# Patient Record
Sex: Male | Born: 1935 | Race: White | Hispanic: No | Marital: Married | Smoking: Former smoker
Health system: Southern US, Community
[De-identification: ages and names within clinical notes are randomized; demographics above are authoritative.]

## PROBLEM LIST (undated history)

## (undated) DIAGNOSIS — I10 Essential (primary) hypertension: Secondary | ICD-10-CM

## (undated) DIAGNOSIS — M5136 Other intervertebral disc degeneration, lumbar region: Secondary | ICD-10-CM

## (undated) DIAGNOSIS — N289 Disorder of kidney and ureter, unspecified: Secondary | ICD-10-CM

## (undated) DIAGNOSIS — H409 Unspecified glaucoma: Secondary | ICD-10-CM

## (undated) DIAGNOSIS — M1990 Unspecified osteoarthritis, unspecified site: Secondary | ICD-10-CM

## (undated) DIAGNOSIS — E7800 Pure hypercholesterolemia, unspecified: Secondary | ICD-10-CM

---

## 2011-08-29 DIAGNOSIS — Z125 Encounter for screening for malignant neoplasm of prostate: Secondary | ICD-10-CM | POA: Diagnosis not present

## 2011-08-29 DIAGNOSIS — I10 Essential (primary) hypertension: Secondary | ICD-10-CM | POA: Diagnosis not present

## 2011-08-29 DIAGNOSIS — E785 Hyperlipidemia, unspecified: Secondary | ICD-10-CM | POA: Diagnosis not present

## 2011-09-05 DIAGNOSIS — Z0000 Encounter for general adult medical examination without abnormal findings: Secondary | ICD-10-CM | POA: Diagnosis not present

## 2011-09-05 DIAGNOSIS — I10 Essential (primary) hypertension: Secondary | ICD-10-CM | POA: Diagnosis not present

## 2011-09-05 DIAGNOSIS — R972 Elevated prostate specific antigen [PSA]: Secondary | ICD-10-CM | POA: Diagnosis not present

## 2011-09-05 DIAGNOSIS — E785 Hyperlipidemia, unspecified: Secondary | ICD-10-CM | POA: Diagnosis not present

## 2011-10-18 DIAGNOSIS — H409 Unspecified glaucoma: Secondary | ICD-10-CM | POA: Diagnosis not present

## 2011-10-18 DIAGNOSIS — H2510 Age-related nuclear cataract, unspecified eye: Secondary | ICD-10-CM | POA: Diagnosis not present

## 2011-10-18 DIAGNOSIS — H4011X0 Primary open-angle glaucoma, stage unspecified: Secondary | ICD-10-CM | POA: Diagnosis not present

## 2011-10-18 DIAGNOSIS — Z961 Presence of intraocular lens: Secondary | ICD-10-CM | POA: Diagnosis not present

## 2012-03-05 DIAGNOSIS — E669 Obesity, unspecified: Secondary | ICD-10-CM | POA: Diagnosis not present

## 2012-03-05 DIAGNOSIS — E785 Hyperlipidemia, unspecified: Secondary | ICD-10-CM | POA: Diagnosis not present

## 2012-03-05 DIAGNOSIS — N401 Enlarged prostate with lower urinary tract symptoms: Secondary | ICD-10-CM | POA: Diagnosis not present

## 2012-03-05 DIAGNOSIS — I10 Essential (primary) hypertension: Secondary | ICD-10-CM | POA: Diagnosis not present

## 2012-04-15 DIAGNOSIS — H409 Unspecified glaucoma: Secondary | ICD-10-CM | POA: Diagnosis not present

## 2012-04-15 DIAGNOSIS — H4011X0 Primary open-angle glaucoma, stage unspecified: Secondary | ICD-10-CM | POA: Diagnosis not present

## 2012-09-04 DIAGNOSIS — E785 Hyperlipidemia, unspecified: Secondary | ICD-10-CM | POA: Diagnosis not present

## 2012-09-04 DIAGNOSIS — Z125 Encounter for screening for malignant neoplasm of prostate: Secondary | ICD-10-CM | POA: Diagnosis not present

## 2012-09-04 DIAGNOSIS — I10 Essential (primary) hypertension: Secondary | ICD-10-CM | POA: Diagnosis not present

## 2012-10-15 DIAGNOSIS — H409 Unspecified glaucoma: Secondary | ICD-10-CM | POA: Diagnosis not present

## 2012-10-15 DIAGNOSIS — H4011X0 Primary open-angle glaucoma, stage unspecified: Secondary | ICD-10-CM | POA: Diagnosis not present

## 2013-03-11 DIAGNOSIS — N401 Enlarged prostate with lower urinary tract symptoms: Secondary | ICD-10-CM | POA: Diagnosis not present

## 2013-03-11 DIAGNOSIS — M1990 Unspecified osteoarthritis, unspecified site: Secondary | ICD-10-CM | POA: Diagnosis not present

## 2013-03-11 DIAGNOSIS — Z23 Encounter for immunization: Secondary | ICD-10-CM | POA: Diagnosis not present

## 2013-03-11 DIAGNOSIS — Z6834 Body mass index (BMI) 34.0-34.9, adult: Secondary | ICD-10-CM | POA: Diagnosis not present

## 2013-03-11 DIAGNOSIS — E785 Hyperlipidemia, unspecified: Secondary | ICD-10-CM | POA: Diagnosis not present

## 2013-03-11 DIAGNOSIS — I10 Essential (primary) hypertension: Secondary | ICD-10-CM | POA: Diagnosis not present

## 2013-03-11 DIAGNOSIS — R972 Elevated prostate specific antigen [PSA]: Secondary | ICD-10-CM | POA: Diagnosis not present

## 2013-03-11 DIAGNOSIS — H409 Unspecified glaucoma: Secondary | ICD-10-CM | POA: Diagnosis not present

## 2013-03-12 DIAGNOSIS — R972 Elevated prostate specific antigen [PSA]: Secondary | ICD-10-CM | POA: Diagnosis not present

## 2013-04-22 DIAGNOSIS — H4011X0 Primary open-angle glaucoma, stage unspecified: Secondary | ICD-10-CM | POA: Diagnosis not present

## 2013-04-22 DIAGNOSIS — H409 Unspecified glaucoma: Secondary | ICD-10-CM | POA: Diagnosis not present

## 2013-09-12 DIAGNOSIS — E785 Hyperlipidemia, unspecified: Secondary | ICD-10-CM | POA: Diagnosis not present

## 2013-09-12 DIAGNOSIS — I10 Essential (primary) hypertension: Secondary | ICD-10-CM | POA: Diagnosis not present

## 2013-09-12 DIAGNOSIS — Z125 Encounter for screening for malignant neoplasm of prostate: Secondary | ICD-10-CM | POA: Diagnosis not present

## 2013-09-16 DIAGNOSIS — I10 Essential (primary) hypertension: Secondary | ICD-10-CM | POA: Diagnosis not present

## 2013-09-16 DIAGNOSIS — H409 Unspecified glaucoma: Secondary | ICD-10-CM | POA: Diagnosis not present

## 2013-09-16 DIAGNOSIS — Z0000 Encounter for general adult medical examination without abnormal findings: Secondary | ICD-10-CM | POA: Diagnosis not present

## 2013-09-16 DIAGNOSIS — E785 Hyperlipidemia, unspecified: Secondary | ICD-10-CM | POA: Diagnosis not present

## 2013-09-16 DIAGNOSIS — E669 Obesity, unspecified: Secondary | ICD-10-CM | POA: Diagnosis not present

## 2013-09-16 DIAGNOSIS — N401 Enlarged prostate with lower urinary tract symptoms: Secondary | ICD-10-CM | POA: Diagnosis not present

## 2013-09-16 DIAGNOSIS — H6120 Impacted cerumen, unspecified ear: Secondary | ICD-10-CM | POA: Diagnosis not present

## 2013-09-16 DIAGNOSIS — Z23 Encounter for immunization: Secondary | ICD-10-CM | POA: Diagnosis not present

## 2013-09-16 DIAGNOSIS — Z125 Encounter for screening for malignant neoplasm of prostate: Secondary | ICD-10-CM | POA: Diagnosis not present

## 2013-09-16 DIAGNOSIS — M1990 Unspecified osteoarthritis, unspecified site: Secondary | ICD-10-CM | POA: Diagnosis not present

## 2013-09-16 DIAGNOSIS — Z1331 Encounter for screening for depression: Secondary | ICD-10-CM | POA: Diagnosis not present

## 2013-11-04 DIAGNOSIS — Z961 Presence of intraocular lens: Secondary | ICD-10-CM | POA: Diagnosis not present

## 2013-11-04 DIAGNOSIS — H4011X0 Primary open-angle glaucoma, stage unspecified: Secondary | ICD-10-CM | POA: Diagnosis not present

## 2013-11-04 DIAGNOSIS — H2510 Age-related nuclear cataract, unspecified eye: Secondary | ICD-10-CM | POA: Diagnosis not present

## 2013-11-04 DIAGNOSIS — H409 Unspecified glaucoma: Secondary | ICD-10-CM | POA: Diagnosis not present

## 2013-12-16 DIAGNOSIS — H6120 Impacted cerumen, unspecified ear: Secondary | ICD-10-CM | POA: Diagnosis not present

## 2013-12-16 DIAGNOSIS — H9190 Unspecified hearing loss, unspecified ear: Secondary | ICD-10-CM | POA: Diagnosis not present

## 2014-01-02 DIAGNOSIS — Z6834 Body mass index (BMI) 34.0-34.9, adult: Secondary | ICD-10-CM | POA: Diagnosis not present

## 2014-01-02 DIAGNOSIS — H903 Sensorineural hearing loss, bilateral: Secondary | ICD-10-CM | POA: Diagnosis not present

## 2014-01-02 DIAGNOSIS — H908 Mixed conductive and sensorineural hearing loss, unspecified: Secondary | ICD-10-CM | POA: Diagnosis not present

## 2014-01-02 DIAGNOSIS — H9190 Unspecified hearing loss, unspecified ear: Secondary | ICD-10-CM | POA: Diagnosis not present

## 2014-01-02 DIAGNOSIS — H905 Unspecified sensorineural hearing loss: Secondary | ICD-10-CM | POA: Diagnosis not present

## 2014-03-24 DIAGNOSIS — E669 Obesity, unspecified: Secondary | ICD-10-CM | POA: Diagnosis not present

## 2014-03-24 DIAGNOSIS — N138 Other obstructive and reflux uropathy: Secondary | ICD-10-CM | POA: Diagnosis not present

## 2014-03-24 DIAGNOSIS — Z6834 Body mass index (BMI) 34.0-34.9, adult: Secondary | ICD-10-CM | POA: Diagnosis not present

## 2014-03-24 DIAGNOSIS — M1990 Unspecified osteoarthritis, unspecified site: Secondary | ICD-10-CM | POA: Diagnosis not present

## 2014-03-24 DIAGNOSIS — I10 Essential (primary) hypertension: Secondary | ICD-10-CM | POA: Diagnosis not present

## 2014-03-24 DIAGNOSIS — E785 Hyperlipidemia, unspecified: Secondary | ICD-10-CM | POA: Diagnosis not present

## 2014-03-24 DIAGNOSIS — N401 Enlarged prostate with lower urinary tract symptoms: Secondary | ICD-10-CM | POA: Diagnosis not present

## 2014-03-24 DIAGNOSIS — H409 Unspecified glaucoma: Secondary | ICD-10-CM | POA: Diagnosis not present

## 2014-03-24 DIAGNOSIS — H9190 Unspecified hearing loss, unspecified ear: Secondary | ICD-10-CM | POA: Diagnosis not present

## 2014-05-05 DIAGNOSIS — Z961 Presence of intraocular lens: Secondary | ICD-10-CM | POA: Diagnosis not present

## 2014-05-05 DIAGNOSIS — H4011X1 Primary open-angle glaucoma, mild stage: Secondary | ICD-10-CM | POA: Diagnosis not present

## 2014-05-05 DIAGNOSIS — H2512 Age-related nuclear cataract, left eye: Secondary | ICD-10-CM | POA: Diagnosis not present

## 2014-11-04 DIAGNOSIS — H4011X1 Primary open-angle glaucoma, mild stage: Secondary | ICD-10-CM | POA: Diagnosis not present

## 2014-11-04 DIAGNOSIS — H2512 Age-related nuclear cataract, left eye: Secondary | ICD-10-CM | POA: Diagnosis not present

## 2014-11-04 DIAGNOSIS — Z961 Presence of intraocular lens: Secondary | ICD-10-CM | POA: Diagnosis not present

## 2014-11-04 DIAGNOSIS — H53002 Unspecified amblyopia, left eye: Secondary | ICD-10-CM | POA: Diagnosis not present

## 2014-12-09 DIAGNOSIS — E785 Hyperlipidemia, unspecified: Secondary | ICD-10-CM | POA: Diagnosis not present

## 2014-12-09 DIAGNOSIS — I10 Essential (primary) hypertension: Secondary | ICD-10-CM | POA: Diagnosis not present

## 2014-12-09 DIAGNOSIS — Z125 Encounter for screening for malignant neoplasm of prostate: Secondary | ICD-10-CM | POA: Diagnosis not present

## 2014-12-09 DIAGNOSIS — R972 Elevated prostate specific antigen [PSA]: Secondary | ICD-10-CM | POA: Diagnosis not present

## 2014-12-16 DIAGNOSIS — N401 Enlarged prostate with lower urinary tract symptoms: Secondary | ICD-10-CM | POA: Diagnosis not present

## 2014-12-16 DIAGNOSIS — H9190 Unspecified hearing loss, unspecified ear: Secondary | ICD-10-CM | POA: Diagnosis not present

## 2014-12-16 DIAGNOSIS — R972 Elevated prostate specific antigen [PSA]: Secondary | ICD-10-CM | POA: Diagnosis not present

## 2014-12-16 DIAGNOSIS — H409 Unspecified glaucoma: Secondary | ICD-10-CM | POA: Diagnosis not present

## 2014-12-16 DIAGNOSIS — E669 Obesity, unspecified: Secondary | ICD-10-CM | POA: Diagnosis not present

## 2014-12-16 DIAGNOSIS — I10 Essential (primary) hypertension: Secondary | ICD-10-CM | POA: Diagnosis not present

## 2014-12-16 DIAGNOSIS — M1990 Unspecified osteoarthritis, unspecified site: Secondary | ICD-10-CM | POA: Diagnosis not present

## 2014-12-16 DIAGNOSIS — E785 Hyperlipidemia, unspecified: Secondary | ICD-10-CM | POA: Diagnosis not present

## 2014-12-16 DIAGNOSIS — Z1389 Encounter for screening for other disorder: Secondary | ICD-10-CM | POA: Diagnosis not present

## 2014-12-16 DIAGNOSIS — D692 Other nonthrombocytopenic purpura: Secondary | ICD-10-CM | POA: Diagnosis not present

## 2014-12-16 DIAGNOSIS — Z6833 Body mass index (BMI) 33.0-33.9, adult: Secondary | ICD-10-CM | POA: Diagnosis not present

## 2014-12-16 DIAGNOSIS — Z0000 Encounter for general adult medical examination without abnormal findings: Secondary | ICD-10-CM | POA: Diagnosis not present

## 2015-05-12 DIAGNOSIS — H2512 Age-related nuclear cataract, left eye: Secondary | ICD-10-CM | POA: Diagnosis not present

## 2015-05-12 DIAGNOSIS — Z961 Presence of intraocular lens: Secondary | ICD-10-CM | POA: Diagnosis not present

## 2015-05-12 DIAGNOSIS — H401132 Primary open-angle glaucoma, bilateral, moderate stage: Secondary | ICD-10-CM | POA: Diagnosis not present

## 2015-09-10 ENCOUNTER — Emergency Department (HOSPITAL_COMMUNITY): Payer: Medicare Other

## 2015-09-10 ENCOUNTER — Inpatient Hospital Stay (HOSPITAL_COMMUNITY)
Admission: EM | Admit: 2015-09-10 | Discharge: 2015-09-17 | DRG: 549 | Disposition: A | Discharge status: Home Health | Payer: Medicare Other | Attending: Internal Medicine | Admitting: Internal Medicine

## 2015-09-10 ENCOUNTER — Encounter (HOSPITAL_COMMUNITY): Payer: Self-pay | Admitting: Emergency Medicine

## 2015-09-10 DIAGNOSIS — M545 Low back pain, unspecified: Secondary | ICD-10-CM

## 2015-09-10 DIAGNOSIS — M25552 Pain in left hip: Secondary | ICD-10-CM | POA: Diagnosis present

## 2015-09-10 DIAGNOSIS — Z880 Allergy status to penicillin: Secondary | ICD-10-CM

## 2015-09-10 DIAGNOSIS — Z87891 Personal history of nicotine dependence: Secondary | ICD-10-CM | POA: Diagnosis not present

## 2015-09-10 DIAGNOSIS — E7800 Pure hypercholesterolemia, unspecified: Secondary | ICD-10-CM | POA: Diagnosis not present

## 2015-09-10 DIAGNOSIS — M549 Dorsalgia, unspecified: Secondary | ICD-10-CM | POA: Diagnosis present

## 2015-09-10 DIAGNOSIS — B953 Streptococcus pneumoniae as the cause of diseases classified elsewhere: Secondary | ICD-10-CM | POA: Diagnosis present

## 2015-09-10 DIAGNOSIS — M4646 Discitis, unspecified, lumbar region: Secondary | ICD-10-CM | POA: Diagnosis present

## 2015-09-10 DIAGNOSIS — N179 Acute kidney failure, unspecified: Secondary | ICD-10-CM | POA: Diagnosis not present

## 2015-09-10 DIAGNOSIS — M1990 Unspecified osteoarthritis, unspecified site: Secondary | ICD-10-CM | POA: Diagnosis present

## 2015-09-10 DIAGNOSIS — E785 Hyperlipidemia, unspecified: Secondary | ICD-10-CM | POA: Diagnosis present

## 2015-09-10 DIAGNOSIS — M4626 Osteomyelitis of vertebra, lumbar region: Secondary | ICD-10-CM | POA: Diagnosis present

## 2015-09-10 DIAGNOSIS — H9190 Unspecified hearing loss, unspecified ear: Secondary | ICD-10-CM | POA: Diagnosis not present

## 2015-09-10 DIAGNOSIS — Z79899 Other long term (current) drug therapy: Secondary | ICD-10-CM

## 2015-09-10 DIAGNOSIS — I10 Essential (primary) hypertension: Secondary | ICD-10-CM | POA: Diagnosis not present

## 2015-09-10 DIAGNOSIS — M0028 Other streptococcal arthritis, vertebrae: Principal | ICD-10-CM | POA: Diagnosis present

## 2015-09-10 DIAGNOSIS — H6092 Unspecified otitis externa, left ear: Secondary | ICD-10-CM | POA: Diagnosis not present

## 2015-09-10 DIAGNOSIS — R52 Pain, unspecified: Secondary | ICD-10-CM

## 2015-09-10 DIAGNOSIS — Z7982 Long term (current) use of aspirin: Secondary | ICD-10-CM

## 2015-09-10 DIAGNOSIS — R509 Fever, unspecified: Secondary | ICD-10-CM | POA: Diagnosis not present

## 2015-09-10 DIAGNOSIS — K5900 Constipation, unspecified: Secondary | ICD-10-CM | POA: Diagnosis not present

## 2015-09-10 DIAGNOSIS — M5136 Other intervertebral disc degeneration, lumbar region: Secondary | ICD-10-CM | POA: Diagnosis not present

## 2015-09-10 DIAGNOSIS — H409 Unspecified glaucoma: Secondary | ICD-10-CM | POA: Diagnosis present

## 2015-09-10 DIAGNOSIS — M791 Myalgia: Secondary | ICD-10-CM | POA: Diagnosis present

## 2015-09-10 DIAGNOSIS — M1612 Unilateral primary osteoarthritis, left hip: Secondary | ICD-10-CM | POA: Diagnosis not present

## 2015-09-10 DIAGNOSIS — R6889 Other general symptoms and signs: Secondary | ICD-10-CM | POA: Diagnosis present

## 2015-09-10 DIAGNOSIS — R05 Cough: Secondary | ICD-10-CM | POA: Diagnosis not present

## 2015-09-10 DIAGNOSIS — G8929 Other chronic pain: Secondary | ICD-10-CM | POA: Diagnosis present

## 2015-09-10 DIAGNOSIS — M869 Osteomyelitis, unspecified: Secondary | ICD-10-CM

## 2015-09-10 DIAGNOSIS — E876 Hypokalemia: Secondary | ICD-10-CM | POA: Diagnosis present

## 2015-09-10 DIAGNOSIS — R7881 Bacteremia: Secondary | ICD-10-CM

## 2015-09-10 HISTORY — DX: Pure hypercholesterolemia, unspecified: E78.00

## 2015-09-10 HISTORY — DX: Unspecified osteoarthritis, unspecified site: M19.90

## 2015-09-10 HISTORY — DX: Essential (primary) hypertension: I10

## 2015-09-10 HISTORY — DX: Other intervertebral disc degeneration, lumbar region: M51.36

## 2015-09-10 HISTORY — DX: Unspecified glaucoma: H40.9

## 2015-09-10 LAB — CBC WITH DIFFERENTIAL/PLATELET
Basophils Absolute: 0 10*3/uL (ref 0.0–0.1)
Basophils Relative: 0 %
EOS ABS: 0 10*3/uL (ref 0.0–0.7)
Eosinophils Relative: 0 %
HCT: 39.7 % (ref 39.0–52.0)
Hemoglobin: 13.6 g/dL (ref 13.0–17.0)
LYMPHS PCT: 5 %
Lymphs Abs: 0.6 10*3/uL — ABNORMAL LOW (ref 0.7–4.0)
MCH: 30.6 pg (ref 26.0–34.0)
MCHC: 34.3 g/dL (ref 30.0–36.0)
MCV: 89.2 fL (ref 78.0–100.0)
MONO ABS: 1.2 10*3/uL — AB (ref 0.1–1.0)
Monocytes Relative: 10 %
NEUTROS PCT: 85 %
Neutro Abs: 10.6 10*3/uL — ABNORMAL HIGH (ref 1.7–7.7)
PLATELETS: 141 10*3/uL — AB (ref 150–400)
RBC: 4.45 MIL/uL (ref 4.22–5.81)
RDW: 14.9 % (ref 11.5–15.5)
WBC: 12.4 10*3/uL — AB (ref 4.0–10.5)

## 2015-09-10 LAB — URINE MICROSCOPIC-ADD ON: SQUAMOUS EPITHELIAL / LPF: NONE SEEN

## 2015-09-10 LAB — BASIC METABOLIC PANEL
ANION GAP: 10 (ref 5–15)
BUN: 25 mg/dL — ABNORMAL HIGH (ref 6–20)
CALCIUM: 9.1 mg/dL (ref 8.9–10.3)
CO2: 21 mmol/L — ABNORMAL LOW (ref 22–32)
CREATININE: 1.36 mg/dL — AB (ref 0.61–1.24)
Chloride: 103 mmol/L (ref 101–111)
GFR, EST AFRICAN AMERICAN: 53 mL/min — AB (ref >60)
GFR, EST NON AFRICAN AMERICAN: 46 mL/min — AB (ref >60)
Glucose, Bld: 132 mg/dL — ABNORMAL HIGH (ref 65–99)
Potassium: 3.8 mmol/L (ref 3.5–5.1)
SODIUM: 134 mmol/L — AB (ref 135–145)

## 2015-09-10 LAB — I-STAT CG4 LACTIC ACID, ED
LACTIC ACID, VENOUS: 0.85 mmol/L (ref 0.5–2.0)
Lactic Acid, Venous: 0.83 mmol/L (ref 0.5–2.0)

## 2015-09-10 LAB — URINALYSIS, ROUTINE W REFLEX MICROSCOPIC
BILIRUBIN URINE: NEGATIVE
GLUCOSE, UA: NEGATIVE mg/dL
KETONES UR: NEGATIVE mg/dL
LEUKOCYTES UA: NEGATIVE
NITRITE: NEGATIVE
PH: 6 (ref 5.0–8.0)
PROTEIN: 100 mg/dL — AB
Specific Gravity, Urine: 1.026 (ref 1.005–1.030)

## 2015-09-10 MED ORDER — LEVOFLOXACIN IN D5W 750 MG/150ML IV SOLN
750.0000 mg | Freq: Once | INTRAVENOUS | Status: DC
  Administered 2015-09-11: 750 mg via INTRAVENOUS
  Filled 2015-09-10: qty 150

## 2015-09-10 MED ORDER — SODIUM CHLORIDE 0.9 % IV BOLUS (SEPSIS)
1000.0000 mL | Freq: Once | INTRAVENOUS | Status: AC
  Administered 2015-09-10: 1000 mL via INTRAVENOUS

## 2015-09-10 MED ORDER — ACETAMINOPHEN 325 MG PO TABS
650.0000 mg | ORAL_TABLET | Freq: Once | ORAL | Status: AC
  Administered 2015-09-10: 650 mg via ORAL
  Filled 2015-09-10: qty 2

## 2015-09-10 MED ORDER — VANCOMYCIN HCL IN DEXTROSE 1-5 GM/200ML-% IV SOLN: 1000.0000 mg | Freq: Once | INTRAVENOUS | Status: DC

## 2015-09-10 MED ORDER — DEXTROSE 5 % IV SOLN
2.0000 g | Freq: Once | INTRAVENOUS | Status: AC
  Administered 2015-09-11: 2 g via INTRAVENOUS
  Filled 2015-09-10: qty 2

## 2015-09-10 MED ORDER — CIPROFLOXACIN-DEXAMETHASONE 0.3-0.1 % OT SUSP
4.0000 [drp] | Freq: Two times a day (BID) | OTIC | Status: AC
  Administered 2015-09-10: 4 [drp] via OTIC
  Filled 2015-09-10: qty 7.5

## 2015-09-10 NOTE — ED Notes (Signed)
Bed: WU98 Expected date:  Expected time:  Means of arrival:  Comments: EMS 61M L hip pain(non traumatic)/ear ache/chills/

## 2015-09-10 NOTE — ED Provider Notes (Signed)
CSN: [id number]     Arrival date & time 09/10/15  2043 History   First MD Initiated Contact with Patient 09/10/15 2126     Chief Complaint  Patient presents with  . Otalgia  . Back Pain  . Chills   Bader Stubblefield is a 82 y.o. male who presents to the emergency department complaining of severe atraumatic left hip pain since yesterday. Patient also reports bilateral low back pain, fevers, and chills. He also reports a slight cough ongoing for the past week. He also reports left ear pain with drainage for the past week. He spoke with his primary care doctor yesterday who called him in a prescription for Tamiflu. He has been taking Tamiflu since yesterday. He reports lots of fevers and chills since yesterday. No sick contacts at home. He currently complains of severe bilateral low back pain and left hip pain that is worse with movement. He denies chest pain, shortness of breath, wheezing, sore throat, trouble swallowing, neck pain, neck stiffness, headache, changes to his vision, urinary symptoms, abdominal pain, nausea, vomiting, diarrhea, falls, loss of bladder control, loss of bowel control, difficulty urinating, or rashes.  The history is provided by the patient and the spouse. No language interpreter was used.    Past Medical History  Diagnosis Date  . Hypertension   . Hypercholesteremia    History reviewed. No pertinent past surgical history. History reviewed. No pertinent family history. Social History  Substance Use Topics  . Smoking status: Former Smoker    Quit date: 09/09/1953  . Smokeless tobacco: Former Neurosurgeon    Types: Chew    Quit date: 09/09/1953  . Alcohol Use: Yes     Comment: every day 4 oz (bourbon) before dinner    Review of Systems  Constitutional: Positive for fever and chills.  HENT: Positive for ear discharge and ear pain. Negative for congestion and sore throat.   Eyes: Negative for pain, discharge and visual disturbance.  Respiratory: Positive for cough. Negative  for shortness of breath and wheezing.   Cardiovascular: Negative for chest pain.  Gastrointestinal: Negative for nausea, vomiting, abdominal pain and diarrhea.  Genitourinary: Negative for dysuria, hematuria and difficulty urinating.  Musculoskeletal: Positive for myalgias, back pain and arthralgias. Negative for neck pain and neck stiffness.  Skin: Negative for rash.  Neurological: Negative for syncope and headaches.      Allergies  Penicillins  Home Medications   Prior to Admission medications   Medication Sig Start Date End Date Taking? Authorizing Provider  aspirin EC 81 MG tablet Take 81 mg by mouth daily.   Yes Historical Provider, MD  atenolol (TENORMIN) 50 MG tablet Take 50 mg by mouth daily.  08/19/15  Yes Historical Provider, MD  atorvastatin (LIPITOR) 40 MG tablet Take 40 mg by mouth daily.  08/19/15  Yes Historical Provider, MD  AZOPT 1 % ophthalmic suspension Place 1 drop into both eyes 3 (three) times daily.  09/06/15  Yes Historical Provider, MD  diclofenac (VOLTAREN) 75 MG EC tablet Take 75 mg by mouth 2 (two) times daily.  09/06/15  Yes Historical Provider, MD  latanoprost (XALATAN) 0.005 % ophthalmic solution Place 1 drop into both eyes at bedtime.  09/06/15  Yes Historical Provider, MD  Multiple Vitamin (MULTIVITAMIN WITH MINERALS) TABS tablet Take 1 tablet by mouth daily.   Yes Historical Provider, MD  oseltamivir (TAMIFLU) 75 MG capsule Take 75 mg by mouth 2 (two) times daily.  09/09/15  Yes Historical Provider, MD  terazosin (HYTRIN) 5  MG capsule Take 5 mg by mouth 2 (two) times daily.  09/06/15  Yes Historical Provider, MD  timolol (TIMOPTIC-XR) 0.5 % ophthalmic gel-forming Place 1 drop into both eyes at bedtime.  09/06/15  Yes Historical Provider, MD   BP 142/62 mmHg  Pulse 68  Temp(Src) 101.3 F (38.5 C) (Oral)  Resp 15  Ht 5\' 10"  (1.778 m)  Wt 104.327 kg  BMI 33.00 kg/m2  SpO2 98% Physical Exam  Constitutional: He is oriented to person, place, and time. He  appears well-developed and well-nourished. No distress.  Nontoxic appearing.  HENT:  Head: Normocephalic and atraumatic.  Right Ear: External ear normal.  Mouth/Throat: Oropharynx is clear and moist. No oropharyngeal exudate.  Mild left external auditory canal edema with white debris and external auditory canal. TM on left is partially visualized and appears intact. Right TM normal.  Eyes: Conjunctivae are normal. Pupils are equal, round, and reactive to light. Right eye exhibits no discharge. Left eye exhibits no discharge.  Neck: Normal range of motion. Neck supple. No JVD present. No tracheal deviation present.  Cardiovascular: Normal rate, regular rhythm, normal heart sounds and intact distal pulses.  Exam reveals no gallop and no friction rub.   No murmur heard. Pulmonary/Chest: Effort normal and breath sounds normal. No respiratory distress. He has no wheezes. He has no rales.  Lungs clear to auscultation bilaterally.  Abdominal: Soft. Bowel sounds are normal. He exhibits no distension. There is no tenderness. There is no guarding.  Musculoskeletal: Normal range of motion. He exhibits no edema or tenderness.  No pelvic instability noted. No midline back tenderness. No back edema, deformity, ecchymosis or erythema. No tenderness with range of motion of his bilateral lower legs. Patient has chronic bilateral lower extremity edema.  Lymphadenopathy:    He has no cervical adenopathy.  Neurological: He is alert and oriented to person, place, and time. Coordination normal.  Sensation is intact in his bilateral upper and lower extremities.  Skin: Skin is warm and dry. No rash noted. He is not diaphoretic. No erythema. No pallor.  Psychiatric: He has a normal mood and affect. His behavior is normal.  Nursing note and vitals reviewed.   ED Course  Procedures (including critical care time) Labs Review Labs Reviewed  BASIC METABOLIC PANEL - Abnormal; Notable for the following:    Sodium 134  (*)    CO2 21 (*)    Glucose, Bld 132 (*)    BUN 25 (*)    Creatinine, Ser 1.36 (*)    GFR calc non Af Amer 46 (*)    GFR calc Af Amer 53 (*)    All other components within normal limits  CBC WITH DIFFERENTIAL/PLATELET - Abnormal; Notable for the following:    WBC 12.4 (*)    Platelets 141 (*)    Neutro Abs 10.6 (*)    Lymphs Abs 0.6 (*)    Monocytes Absolute 1.2 (*)    All other components within normal limits  URINALYSIS, ROUTINE W REFLEX MICROSCOPIC (NOT AT [facility]) - Abnormal; Notable for the following:    Hgb urine dipstick TRACE (*)    Protein, ur 100 (*)    All other components within normal limits  URINE MICROSCOPIC-ADD ON - Abnormal; Notable for the following:    Bacteria, UA FEW (*)    Casts HYALINE CASTS (*)    All other components within normal limits  CULTURE, BLOOD (ROUTINE X 2)  CULTURE, BLOOD (ROUTINE X 2)  URINE CULTURE  INFLUENZA PANEL  BY PCR (TYPE A & B, H1N1)  CK  I-STAT CG4 LACTIC ACID, ED  I-STAT CG4 LACTIC ACID, ED    Imaging Review Dg Chest 2 View  09/10/2015  CLINICAL DATA:  Cough and fever. EXAM: CHEST  2 VIEW COMPARISON:  None. FINDINGS: The cardiomediastinal contours are normal. The lungs are clear. Pulmonary vasculature is normal. No consolidation, pleural effusion, or pneumothorax. No acute osseous abnormalities are seen. IMPRESSION: No acute pulmonary process. Electronically Signed   By: Rubye Oaks M.D.   On: 09/10/2015 22:39   Dg Lumbar Spine Complete  09/10/2015  CLINICAL DATA:  Bilateral lumbosacral back pain, fever. EXAM: LUMBAR SPINE - COMPLETE 4+ VIEW COMPARISON:  None. FINDINGS: Mild broad-based rightward curvature of the spine. No fracture or compression deformity. Disc space narrowing at L4-L5, L5-S1, and L2-L3 with associated endplate spurring. There is facet arthropathy throughout. Atherosclerosis of the abdominal aorta noted. IMPRESSION: Degenerative disc disease and facet arthropathy with mild scoliosis. No acute bony abnormality.  Electronically Signed   By: Rubye Oaks M.D.   On: 09/10/2015 22:43   Dg Hip Unilat With Pelvis 2-3 Views Left  09/10/2015  CLINICAL DATA:  Left hip and lumbosacral back pain for 1 day. No known injury. EXAM: DG HIP (WITH OR WITHOUT PELVIS) 2-3V LEFT COMPARISON:  None. FINDINGS: The cortical margins of the bony pelvis and left hip are intact. No fracture. Pubic symphysis and sacroiliac joints are congruent. Both femoral heads are well-seated in the respective acetabula. Minimal acetabular spurring bilaterally. No destructive bony change. Scattered enthesopathic changes are noted. 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  No acute bony abnormality. Electronically Signed   By: Rubye Oaks M.D.   On: 09/10/2015 22:41   I have personally reviewed and evaluated these images and lab results as part of my medical decision-making.   EKG Interpretation None      Filed Vitals:   09/10/15 2059 09/10/15 2110 09/10/15 2342 09/10/15 2345  BP: 168/67   142/62  Pulse: 84   68  Temp: 99.8 F (37.7 C) 101.3 F (38.5 C)    TempSrc: Oral Oral    Resp: 20   15  Height:    (1.778 m)   Weight:   104.327 kg   SpO2: 95%   98%     MDM   Meds given in ED:  Medications  vancomycin (VANCOCIN) 2,000 mg in sodium chloride 0.9 % 500 mL IVPB (2,000 mg Intravenous New Bag/Given 09/11/15 0134)  vancomycin (VANCOCIN) 1,250 mg in sodium chloride 0.9 % 250 mL IVPB (not administered)  aztreonam (AZACTAM) 2 g in dextrose 5 % 50 mL IVPB (not administered)  ciprofloxacin-dexamethasone (CIPRODEX) 0.3-0.1 % otic suspension 4 drop (4 drops Left Ear Given 09/10/15 2302)  sodium chloride 0.9 % bolus 1,000 mL (0 mLs Intravenous Stopped 09/10/15 2305)  acetaminophen (TYLENOL) tablet 650 mg (650 mg Oral Given 09/10/15 2300)  aztreonam (AZACTAM) 2 g in dextrose 5 % 50 mL IVPB (0 g Intravenous Stopped 09/11/15 0047)    Current Discharge Medication List      Final diagnoses:  Fever of unknown origin  Otitis  externa, left  Left hip pain  Bilateral low back pain without sciatica   This is a 82 y.o. male who presents to the emergency department complaining of severe atraumatic left hip pain since yesterday. Patient also reports bilateral low back pain, fevers, and chills. He also reports a slight cough ongoing for the past week. He also reports left ear pain with drainage for  the past week. He spoke with his primary care doctor yesterday who called him in a prescription for Tamiflu. He has been taking Tamiflu since yesterday. He reports lots of fevers and chills since yesterday. No sick contacts at home. He currently complains of severe bilateral low back pain and left hip pain that is worse with movement. He denies chest pain, shortness of breath. The patient has an initial temperature 101.3. On exam he is nontoxic appearing. He is not hypotensive, tachycardic or tachypneic. His lungs are clear to auscultation bilaterally. He has evidence of a left otitis externa. Abdomen is soft and nontender to palpation. He has good range of motion of his bilateral lower extremities. No midline back tenderness. No concern for cauda equina. No loss of bowel or bladder control. Will initiate sepsis workup. Patient started on Ciprodex drops for the left ear otitis externa. Patient's initial lactic acid is within normal limits. Urinalysis is nitrite and leukocyte negative. BMP reveals an elevated creatinine of 1.36. He has a leukocytosis with a white count of 12,400. Blood cultures pending. Chest x-ray unremarkable. Lumbar spine films and left hip films show no acute abnormality. Flu swab is pending. I do suspect flu. After discussion with my attending Dr. Freida Busman, he would like the patient started on IV antibiotics for fever of unknown origin and admitted to the hospital. I agree with this plan.  Patient and family are in agreement with admission.  I consulted with Dr. Arlean Hopping who accepted the patient for admission.   This  patient was discussed with and evaluated by Dr. Freida Busman who agrees with assessment and plan.    Everlene Farrier, PA-C 09/11/15 0157

## 2015-09-10 NOTE — ED Provider Notes (Signed)
Medical screening examination/treatment/procedure(s) were conducted as a shared visit with non-physician practitioner(s) and myself.  I personally evaluated the patient during the encounter.   EKG Interpretation None     Patient here with fever and chills and left ear drainage since Wednesday. He is febrile here and has leukocytosis. Was started on IV antibiotics and admitted to medicine  Lorre Nick, MD 09/10/15 2337

## 2015-09-10 NOTE — ED Notes (Signed)
[facility] presents with a 82 yo male from with reddish left ear drainage since Wednesday.  Pt states he felt feverish, chills, and left hip and left lower back pain on Thursday but saw PCP and was given Tamaflu for the left ear drainage and flu-like symptoms.  He took tylenol on Thursday night but only helped a couple of hours.  Left hip return around 6 am this morning and became severe around 8 pm and called 911.  Hx HTN, BP with [facility] 197/103 other vitals stable.

## 2015-09-10 NOTE — ED Notes (Signed)
Pt. Made aware for the need of urine. 

## 2015-09-11 ENCOUNTER — Encounter (HOSPITAL_COMMUNITY): Payer: Self-pay | Admitting: Internal Medicine

## 2015-09-11 DIAGNOSIS — I10 Essential (primary) hypertension: Secondary | ICD-10-CM | POA: Diagnosis present

## 2015-09-11 DIAGNOSIS — B999 Unspecified infectious disease: Secondary | ICD-10-CM | POA: Diagnosis not present

## 2015-09-11 DIAGNOSIS — M25552 Pain in left hip: Secondary | ICD-10-CM | POA: Diagnosis not present

## 2015-09-11 DIAGNOSIS — Z79899 Other long term (current) drug therapy: Secondary | ICD-10-CM | POA: Diagnosis not present

## 2015-09-11 DIAGNOSIS — E876 Hypokalemia: Secondary | ICD-10-CM | POA: Diagnosis not present

## 2015-09-11 DIAGNOSIS — M4636 Infection of intervertebral disc (pyogenic), lumbar region: Secondary | ICD-10-CM | POA: Diagnosis not present

## 2015-09-11 DIAGNOSIS — M545 Low back pain, unspecified: Secondary | ICD-10-CM | POA: Insufficient documentation

## 2015-09-11 DIAGNOSIS — B953 Streptococcus pneumoniae as the cause of diseases classified elsewhere: Secondary | ICD-10-CM | POA: Diagnosis not present

## 2015-09-11 DIAGNOSIS — M869 Osteomyelitis, unspecified: Secondary | ICD-10-CM | POA: Diagnosis not present

## 2015-09-11 DIAGNOSIS — H409 Unspecified glaucoma: Secondary | ICD-10-CM

## 2015-09-11 DIAGNOSIS — R6889 Other general symptoms and signs: Secondary | ICD-10-CM

## 2015-09-11 DIAGNOSIS — H6690 Otitis media, unspecified, unspecified ear: Secondary | ICD-10-CM | POA: Insufficient documentation

## 2015-09-11 DIAGNOSIS — R7881 Bacteremia: Secondary | ICD-10-CM | POA: Diagnosis present

## 2015-09-11 DIAGNOSIS — R509 Fever, unspecified: Secondary | ICD-10-CM | POA: Insufficient documentation

## 2015-09-11 DIAGNOSIS — M791 Myalgia: Secondary | ICD-10-CM

## 2015-09-11 DIAGNOSIS — M549 Dorsalgia, unspecified: Secondary | ICD-10-CM | POA: Diagnosis not present

## 2015-09-11 DIAGNOSIS — M0028 Other streptococcal arthritis, vertebrae: Secondary | ICD-10-CM | POA: Diagnosis present

## 2015-09-11 DIAGNOSIS — G8929 Other chronic pain: Secondary | ICD-10-CM | POA: Diagnosis present

## 2015-09-11 DIAGNOSIS — Z7982 Long term (current) use of aspirin: Secondary | ICD-10-CM | POA: Diagnosis not present

## 2015-09-11 DIAGNOSIS — H6692 Otitis media, unspecified, left ear: Secondary | ICD-10-CM | POA: Diagnosis not present

## 2015-09-11 DIAGNOSIS — Z880 Allergy status to penicillin: Secondary | ICD-10-CM | POA: Diagnosis not present

## 2015-09-11 DIAGNOSIS — Z87891 Personal history of nicotine dependence: Secondary | ICD-10-CM | POA: Diagnosis not present

## 2015-09-11 DIAGNOSIS — M5136 Other intervertebral disc degeneration, lumbar region: Secondary | ICD-10-CM | POA: Diagnosis present

## 2015-09-11 DIAGNOSIS — M47816 Spondylosis without myelopathy or radiculopathy, lumbar region: Secondary | ICD-10-CM | POA: Diagnosis not present

## 2015-09-11 DIAGNOSIS — K5900 Constipation, unspecified: Secondary | ICD-10-CM | POA: Diagnosis not present

## 2015-09-11 DIAGNOSIS — E785 Hyperlipidemia, unspecified: Secondary | ICD-10-CM | POA: Diagnosis present

## 2015-09-11 DIAGNOSIS — M1990 Unspecified osteoarthritis, unspecified site: Secondary | ICD-10-CM | POA: Diagnosis present

## 2015-09-11 DIAGNOSIS — N179 Acute kidney failure, unspecified: Secondary | ICD-10-CM | POA: Diagnosis not present

## 2015-09-11 DIAGNOSIS — H9202 Otalgia, left ear: Secondary | ICD-10-CM | POA: Diagnosis not present

## 2015-09-11 DIAGNOSIS — E7800 Pure hypercholesterolemia, unspecified: Secondary | ICD-10-CM | POA: Diagnosis present

## 2015-09-11 DIAGNOSIS — H6092 Unspecified otitis externa, left ear: Secondary | ICD-10-CM

## 2015-09-11 DIAGNOSIS — H9190 Unspecified hearing loss, unspecified ear: Secondary | ICD-10-CM | POA: Diagnosis present

## 2015-09-11 LAB — INFLUENZA PANEL BY PCR (TYPE A & B)
H1N1FLUPCR: NOT DETECTED
INFLAPCR: NEGATIVE
Influenza B By PCR: NEGATIVE

## 2015-09-11 LAB — CBC
HEMATOCRIT: 37.6 % — AB (ref 39.0–52.0)
HEMOGLOBIN: 12.6 g/dL — AB (ref 13.0–17.0)
MCH: 30.2 pg (ref 26.0–34.0)
MCHC: 33.5 g/dL (ref 30.0–36.0)
MCV: 90.2 fL (ref 78.0–100.0)
Platelets: 137 10*3/uL — ABNORMAL LOW (ref 150–400)
RBC: 4.17 MIL/uL — ABNORMAL LOW (ref 4.22–5.81)
RDW: 15 % (ref 11.5–15.5)
WBC: 10.4 10*3/uL (ref 4.0–10.5)

## 2015-09-11 LAB — BASIC METABOLIC PANEL
ANION GAP: 10 (ref 5–15)
BUN: 25 mg/dL — ABNORMAL HIGH (ref 6–20)
CHLORIDE: 105 mmol/L (ref 101–111)
CO2: 22 mmol/L (ref 22–32)
Calcium: 8.5 mg/dL — ABNORMAL LOW (ref 8.9–10.3)
Creatinine, Ser: 1.32 mg/dL — ABNORMAL HIGH (ref 0.61–1.24)
GFR calc non Af Amer: 48 mL/min — ABNORMAL LOW (ref >60)
GFR, EST AFRICAN AMERICAN: 55 mL/min — AB (ref >60)
GLUCOSE: 100 mg/dL — AB (ref 65–99)
Potassium: 3.4 mmol/L — ABNORMAL LOW (ref 3.5–5.1)
Sodium: 137 mmol/L (ref 135–145)

## 2015-09-11 LAB — CK: CK TOTAL: 68 U/L (ref 49–397)

## 2015-09-11 MED ORDER — ENOXAPARIN SODIUM 40 MG/0.4ML ~~LOC~~ SOLN
40.0000 mg | SUBCUTANEOUS | Status: DC
  Administered 2015-09-11 – 2015-09-17 (×7): 40 mg via SUBCUTANEOUS
  Filled 2015-09-11 (×8): qty 0.4

## 2015-09-11 MED ORDER — TERAZOSIN HCL 5 MG PO CAPS
5.0000 mg | ORAL_CAPSULE | Freq: Two times a day (BID) | ORAL | Status: DC
  Administered 2015-09-11 – 2015-09-17 (×14): 5 mg via ORAL
  Filled 2015-09-11 (×15): qty 1

## 2015-09-11 MED ORDER — SENNOSIDES-DOCUSATE SODIUM 8.6-50 MG PO TABS
1.0000 | ORAL_TABLET | Freq: Every evening | ORAL | Status: DC | PRN
  Administered 2015-09-12 – 2015-09-14 (×3): 1 via ORAL
  Filled 2015-09-11 (×2): qty 1

## 2015-09-11 MED ORDER — ACETAMINOPHEN 325 MG PO TABS
650.0000 mg | ORAL_TABLET | Freq: Four times a day (QID) | ORAL | Status: DC | PRN
  Administered 2015-09-13 (×2): 650 mg via ORAL
  Filled 2015-09-11 (×2): qty 2

## 2015-09-11 MED ORDER — OSELTAMIVIR PHOSPHATE 30 MG PO CAPS
30.0000 mg | ORAL_CAPSULE | Freq: Two times a day (BID) | ORAL | Status: DC
  Administered 2015-09-11: 30 mg via ORAL
  Filled 2015-09-11 (×2): qty 1

## 2015-09-11 MED ORDER — TIMOLOL MALEATE 0.5 % OP SOLG
1.0000 [drp] | Freq: Every day | OPHTHALMIC | Status: DC
  Administered 2015-09-12 – 2015-09-16 (×5): 1 [drp] via OPHTHALMIC
  Filled 2015-09-11: qty 5

## 2015-09-11 MED ORDER — BRINZOLAMIDE 1 % OP SUSP
1.0000 [drp] | Freq: Three times a day (TID) | OPHTHALMIC | Status: DC
  Administered 2015-09-11 – 2015-09-17 (×18): 1 [drp] via OPHTHALMIC
  Filled 2015-09-11: qty 10

## 2015-09-11 MED ORDER — TIMOLOL MALEATE 0.5 % OP SOLG
1.0000 [drp] | Freq: Every day | OPHTHALMIC | Status: DC
Start: 2015-09-11 — End: 2015-09-11
  Filled 2015-09-11: qty 5

## 2015-09-11 MED ORDER — ONDANSETRON HCL 4 MG/2ML IJ SOLN: 4.0000 mg | Freq: Four times a day (QID) | INTRAMUSCULAR | Status: DC | PRN

## 2015-09-11 MED ORDER — SODIUM CHLORIDE 0.9 % IV SOLN
2000.0000 mg | Freq: Once | INTRAVENOUS | Status: AC
  Administered 2015-09-11: 2000 mg via INTRAVENOUS
  Filled 2015-09-11: qty 2000

## 2015-09-11 MED ORDER — LEVOFLOXACIN IN D5W 750 MG/150ML IV SOLN: 750.0000 mg | INTRAVENOUS | Status: DC

## 2015-09-11 MED ORDER — KCL IN DEXTROSE-NACL 20-5-0.45 MEQ/L-%-% IV SOLN
INTRAVENOUS | Status: DC
  Administered 2015-09-11 (×2): via INTRAVENOUS
  Administered 2015-09-12: 100 mL/h via INTRAVENOUS
  Administered 2015-09-14 (×2): via INTRAVENOUS
  Filled 2015-09-11 (×8): qty 1000

## 2015-09-11 MED ORDER — POTASSIUM CHLORIDE CRYS ER 20 MEQ PO TBCR
20.0000 meq | EXTENDED_RELEASE_TABLET | Freq: Two times a day (BID) | ORAL | Status: AC
  Administered 2015-09-11 (×2): 20 meq via ORAL
  Filled 2015-09-11 (×2): qty 1

## 2015-09-11 MED ORDER — VANCOMYCIN HCL 10 G IV SOLR: 1250.0000 mg | INTRAVENOUS | Status: DC

## 2015-09-11 MED ORDER — TIMOLOL MALEATE 0.5 % OP SOLN
1.0000 [drp] | Freq: Every day | OPHTHALMIC | Status: DC
  Filled 2015-09-11: qty 5

## 2015-09-11 MED ORDER — DEXTROSE 5 % IV SOLN
2.0000 g | Freq: Three times a day (TID) | INTRAVENOUS | Status: DC
  Administered 2015-09-11: 2 g via INTRAVENOUS
  Filled 2015-09-11: qty 2

## 2015-09-11 MED ORDER — ONDANSETRON HCL 4 MG PO TABS: 4.0000 mg | ORAL_TABLET | Freq: Four times a day (QID) | ORAL | Status: DC | PRN

## 2015-09-11 MED ORDER — ASPIRIN EC 81 MG PO TBEC
81.0000 mg | DELAYED_RELEASE_TABLET | Freq: Every day | ORAL | Status: DC
  Administered 2015-09-11 – 2015-09-17 (×7): 81 mg via ORAL
  Filled 2015-09-11 (×7): qty 1

## 2015-09-11 MED ORDER — OSELTAMIVIR PHOSPHATE 75 MG PO CAPS
75.0000 mg | ORAL_CAPSULE | Freq: Two times a day (BID) | ORAL | Status: DC
  Filled 2015-09-11 (×2): qty 1

## 2015-09-11 MED ORDER — ATENOLOL 50 MG PO TABS
50.0000 mg | ORAL_TABLET | Freq: Every day | ORAL | Status: DC
  Administered 2015-09-11 – 2015-09-12 (×2): 50 mg via ORAL
  Filled 2015-09-11 (×3): qty 1

## 2015-09-11 MED ORDER — VANCOMYCIN HCL 10 G IV SOLR
1250.0000 mg | INTRAVENOUS | Status: DC
  Administered 2015-09-12 – 2015-09-13 (×2): 1250 mg via INTRAVENOUS
  Filled 2015-09-11 (×2): qty 1250

## 2015-09-11 MED ORDER — HYDROCODONE-ACETAMINOPHEN 5-325 MG PO TABS
1.0000 | ORAL_TABLET | ORAL | Status: DC | PRN
  Administered 2015-09-11 – 2015-09-12 (×3): 1 via ORAL
  Administered 2015-09-13 – 2015-09-14 (×2): 2 via ORAL
  Filled 2015-09-11: qty 2
  Filled 2015-09-11 (×3): qty 1
  Filled 2015-09-11: qty 2
  Filled 2015-09-11: qty 1

## 2015-09-11 MED ORDER — ACETAMINOPHEN 650 MG RE SUPP: 650.0000 mg | Freq: Four times a day (QID) | RECTAL | Status: DC | PRN

## 2015-09-11 MED ORDER — LATANOPROST 0.005 % OP SOLN
1.0000 [drp] | Freq: Every day | OPHTHALMIC | Status: DC
  Administered 2015-09-12 – 2015-09-16 (×5): 1 [drp] via OPHTHALMIC
  Filled 2015-09-11: qty 2.5

## 2015-09-11 MED ORDER — ADULT MULTIVITAMIN W/MINERALS CH
1.0000 | ORAL_TABLET | Freq: Every day | ORAL | Status: DC
  Administered 2015-09-11 – 2015-09-17 (×7): 1 via ORAL
  Filled 2015-09-11 (×7): qty 1

## 2015-09-11 NOTE — H&P (Signed)
[facility] History and Physical  Alexander Diaz FAO:[id number] DOB: [birth year] DOA: 09/10/2015  Referring physician: Dr. Freida Diaz PCP: No primary care provider on file.   Chief Complaint: Fevers, chills  HPI: Alexander Diaz is a 82 y.o. male with hx of HTN and HL, active, works as Investment banker, corporate close to full-time. Comes to ED with 48 hr history of fevers,chills, myalgias.   Started Wed afternoon with chills. This progressed to muscle aches and fevers.  He had some episodes of chills that were so bad he wondered if he had causes muscle damage.  He had back pain and L hip pain thereafter and comes to ED with these symptoms. He is generally healthy, no recent admission or surgery.  He did have a couple of coughing spells, but no productive cough.    Denies any prodcough, abd pain n/v/d, no dysuria.  Has bad hearing both ears, but L worse than R. He had some L ear pain also with this illness.   No wounds or ulcers, no skinrash, no HA or stiff neck. No confusion. No antibiotics or OTC meds.     Where does patient live home  Can patient participate in ADLs? yes  Past Medical History  Past Medical History  Diagnosis Date  . Hypertension   . Hypercholesteremia    Past Surgical History History reviewed. No pertinent past surgical history. Family History History reviewed. No pertinent family history. Social History  reports that he quit smoking about 62 years ago. He quit smokeless tobacco use about 62 years ago. His smokeless tobacco use included Chew. He reports that he drinks alcohol. He reports that he does not use illicit drugs. Allergies  Allergies  Allergen Reactions  . Penicillins     Has patient had a PCN reaction causing immediate rash, facial/tongue/throat swelling, SOB or lightheadedness with hypotension: No Has patient had a PCN reaction causing severe rash involving mucus membranes or skin necrosis: No Has patient had a PCN reaction that required hospitalization No Has patient  had a PCN reaction occurring within the last 10 years: No If all of the above answers are "NO", then may proceed with Cephalosporin use.    Home medications Prior to Admission medications   Medication Sig Start Date End Date Taking? Authorizing Provider  aspirin EC 81 MG tablet Take 81 mg by mouth daily.   Yes Historical Provider, MD  atenolol (TENORMIN) 50 MG tablet Take 50 mg by mouth daily.  08/19/15  Yes Historical Provider, MD  atorvastatin (LIPITOR) 40 MG tablet Take 40 mg by mouth daily.  08/19/15  Yes Historical Provider, MD  AZOPT 1 % ophthalmic suspension Place 1 drop into both eyes 3 (three) times daily.  09/06/15  Yes Historical Provider, MD  diclofenac (VOLTAREN) 75 MG EC tablet Take 75 mg by mouth 2 (two) times daily.  09/06/15  Yes Historical Provider, MD  latanoprost (XALATAN) 0.005 % ophthalmic solution Place 1 drop into both eyes at bedtime.  09/06/15  Yes Historical Provider, MD  Multiple Vitamin (MULTIVITAMIN WITH MINERALS) TABS tablet Take 1 tablet by mouth daily.   Yes Historical Provider, MD  oseltamivir (TAMIFLU) 75 MG capsule Take 75 mg by mouth 2 (two) times daily.  09/09/15  Yes Historical Provider, MD  terazosin (HYTRIN) 5 MG capsule Take 5 mg by mouth 2 (two) times daily.  09/06/15  Yes Historical Provider, MD  timolol (TIMOPTIC-XR) 0.5 % ophthalmic gel-forming Place 1 drop into both eyes at bedtime.  09/06/15  Yes Historical Provider, MD  Liver Function Tests No results for input(s): AST, ALT, ALKPHOS, BILITOT, PROT, ALBUMIN in the last 168 hours. No results for input(s): LIPASE, AMYLASE in the last 168 hours. CBC  Recent Labs Lab 09/10/15 2147  WBC 12.4*  NEUTROABS 10.6*  HGB 13.6  HCT 39.7  MCV 89.2  PLT 141*   Basic Metabolic Panel  Recent Labs Lab 09/10/15 2147  NA 134*  K 3.8  CL 103  CO2 21*  GLUCOSE 132*  BUN 25*  CREATININE 1.36*  CALCIUM 9.1     Filed Vitals:   09/10/15 2059 09/10/15 2110 09/10/15 2342 09/10/15 2345  BP: 168/67   142/62   Pulse: 84   68  Temp: 99.8 F (37.7 C) 101.3 F (38.5 C)    TempSrc: Oral Oral    Resp: 20   15  Height:    (1.778 m)   Weight:   104.327 kg (230 lb)   SpO2: 95%   98%   Exam: Alert elderly WM  WDWN, no distress No rash, cyanosis or gangrene Sclera anicteric, throat clear w/o erythema / pus R TM clear and grey.  L TM looks ruptured, chronic, no drainage noted Neck no jvd or nodes Chest R clear , some faint rales L base Abd obese , soft ntnd no mass or ascites GU normalmale MS no joint effusion, good ROM all joints including L hip No spinal tenderness to percussion No CVAT Ext no edema, wounds or ulcers Neuro is alert, Ox 3 nf   CXR (independently reviewed) > entirely clear, no acute disease WBC 12k   Creat 1.3  Assessment: 1 Febrile illness - w chills/ rigors, back pain/ hip pain, myalgias/ slight Alexander Diaz.  Suspect viral illness (flu).  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  No evidence PNA clinically.  Back pain/ hip pain may have resulted from severe rigors that he had.  Plan admit, flu swab for PCR,  Empiric IV abx after blood cx's.  F/U cx's.   2 HTN on atenolol/ terazosin 3 Glaucoma - cont drops 4 Back /L hip pain - nothing on exam. Check CPK check for some rhabdo .  5 AKI vsCKD - creat 1.3. No old creat. UA negative.  6 L ear pain - old looking ruptured drum, possible EOM  Plan - IVF, IV abx , f/u cultures, flu swab pcr, home meds ok to contineu.  CPK. Vanc / aztreonam.     DVT Prophylaxis lovenox  Code Status: full  Family Communication: son is here  Disposition Plan: home when better    Alexander Diaz [facility] Pager [phone]  Cell [phone]  If 7PM-7AM, please contact night-coverage [email or web address] Password [facility] 09/11/2015, 12:18 AM

## 2015-09-11 NOTE — H&P (Deleted)
[facility] History and Physical  Kavi Almquist ZOX:[id number] DOB: 1929/01/15 DOA: 09/10/2015  Referring physician: Dr. Freida Busman PCP: No primary care provider on file.   Chief Complaint: Fevers, chills  HPI: Alexander Diaz is a 82 y.o. male with hx of HTN and HL, active, works as Investment banker, corporate close to full-time. Comes to ED with 48 hr history of fevers,chills, myalgias.   Started Wed afternoon with chills. This progressed to muscle aches and fevers.  He had some episodes of chills that were so bad he wondered if he had causes muscle damage.  He had back pain and L hip pain thereafter and comes to ED with these symptoms. He is generally healthy, no recent admission or surgery.  He did have a couple of coughing spells, but no productive cough.    Denies any prodcough, abd pain n/v/d, no dysuria.  Has bad hearing both ears, but L worse than R. He had some L ear pain also with this illness.   No wounds or ulcers, no skinrash, no HA or stiff neck. No confusion. No antibiotics or OTC meds.     Where does patient live home  Can patient participate in ADLs? yes  Past Medical History  Past Medical History  Diagnosis Date  . Hypertension   . Hypercholesteremia    Past Surgical History History reviewed. No pertinent past surgical history. Family History History reviewed. No pertinent family history. Social History  reports that he quit smoking about 62 years ago. He quit smokeless tobacco use about 62 years ago. His smokeless tobacco use included Chew. He reports that he drinks alcohol. He reports that he does not use illicit drugs. Allergies  Allergies  Allergen Reactions  . Penicillins     Has patient had a PCN reaction causing immediate rash, facial/tongue/throat swelling, SOB or lightheadedness with hypotension: No Has patient had a PCN reaction causing severe rash involving mucus membranes or skin necrosis: No Has patient had a PCN reaction that required hospitalization No Has patient  had a PCN reaction occurring within the last 10 years: No If all of the above answers are "NO", then may proceed with Cephalosporin use.    Home medications Prior to Admission medications   Medication Sig Start Date End Date Taking? Authorizing Provider  aspirin EC 81 MG tablet Take 81 mg by mouth daily.   Yes Historical Provider, MD  atenolol (TENORMIN) 50 MG tablet Take 50 mg by mouth daily.  08/19/15  Yes Historical Provider, MD  atorvastatin (LIPITOR) 40 MG tablet Take 40 mg by mouth daily.  08/19/15  Yes Historical Provider, MD  AZOPT 1 % ophthalmic suspension Place 1 drop into both eyes 3 (three) times daily.  09/06/15  Yes Historical Provider, MD  diclofenac (VOLTAREN) 75 MG EC tablet Take 75 mg by mouth 2 (two) times daily.  09/06/15  Yes Historical Provider, MD  latanoprost (XALATAN) 0.005 % ophthalmic solution Place 1 drop into both eyes at bedtime.  09/06/15  Yes Historical Provider, MD  Multiple Vitamin (MULTIVITAMIN WITH MINERALS) TABS tablet Take 1 tablet by mouth daily.   Yes Historical Provider, MD  oseltamivir (TAMIFLU) 75 MG capsule Take 75 mg by mouth 2 (two) times daily.  09/09/15  Yes Historical Provider, MD  terazosin (HYTRIN) 5 MG capsule Take 5 mg by mouth 2 (two) times daily.  09/06/15  Yes Historical Provider, MD  timolol (TIMOPTIC-XR) 0.5 % ophthalmic gel-forming Place 1 drop into both eyes at bedtime.  09/06/15  Yes Historical Provider, MD  Liver Function Tests No results for input(s): AST, ALT, ALKPHOS, BILITOT, PROT, ALBUMIN in the last 168 hours. No results for input(s): LIPASE, AMYLASE in the last 168 hours. CBC  Recent Labs Lab 09/10/15 2147  WBC 12.4*  NEUTROABS 10.6*  HGB 13.6  HCT 39.7  MCV 89.2  PLT 141*   Basic Metabolic Panel  Recent Labs Lab 09/10/15 2147  NA 134*  K 3.8  CL 103  CO2 21*  GLUCOSE 132*  BUN 25*  CREATININE 1.36*  CALCIUM 9.1     Filed Vitals:   09/10/15 2059 09/10/15 2110 09/10/15 2342 09/10/15 2345  BP: 168/67   142/62   Pulse: 84   68  Temp: 99.8 F (37.7 C) 101.3 F (38.5 C)    TempSrc: Oral Oral    Resp: 20   15  Height:    (1.778 m)   Weight:   104.327 kg (230 lb)   SpO2: 95%   98%   Exam: Alert elderly WM  WDWN, no distress No rash, cyanosis or gangrene Sclera anicteric, throat clear w/o erythema / pus R TM clear and grey.  L TM looks ruptured, chronic, no drainage noted Neck no jvd or nodes Chest R clear , some faint rales L base Abd obese , soft ntnd no mass or ascites GU normalmale MS no joint effusion, good ROM all joints including L hip No spinal tenderness to percussion No CVAT Ext no edema, wounds or ulcers Neuro is alert, Ox 3 nf   CXR (independently reviewed) > entirely clear, no acute disease WBC 12k   Creat 1.3  Assessment: 1 Febrile illness - w chills/ rigors, back pain/ hip pain, myalgias/ slight [name].  Suspect viral illness (flu).  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  No evidence PNA clinically.  Back pain/ hip pain may have resulted from severe rigors that he had.  Plan admit, flu swab for PCR,  Empiric IV abx after blood cx's.  F/U cx's.   2 HTN on atenolol/ terazosin 3 Glaucoma - cont drops 4 Back /L hip pain - nothing on exam. Check CPK check for some rhabdo .  5 AKI vsCKD - creat 1.3. No old creat. UA negative.  6 L ear pain - old looking ruptured drum, possible EOM  Plan - IVF, IV abx , f/u cultures, flu swab pcr, home meds ok to contineu.  CPK. Vanc / aztreonam.     DVT Prophylaxis lovenox  Code Status: full  Family Communication: son is here  Disposition Plan: home when better    Maree Krabbe [facility] Pager [phone]  Cell [phone]  If 7PM-7AM, please contact night-coverage [email or web address] Password [facility] 09/11/2015, 12:19 AM

## 2015-09-11 NOTE — Progress Notes (Signed)
Pharmacy Antibiotic Note  Alexander Diaz is a 82 y.o. male admitted on 09/10/2015 with sepsis.  Pharmacy has been consulted for Vancomycin, Levofloxacin & Aztreonam dosing.  Patient presented with left ear drainage, fever, chills and hip & lower back pain.    Plan:  Aztreonam 2gm IV x 1 in the ED followed by 2gm IV q8h  Levofloxacin  IV x 1 in the ED followed by  IV q48h  Vancomycin 2gm IV x 1 in the ED followed by  IV q24h  F/U cultures/sensitivities  Check vancomycin trough level when appropriate  Height:  (177.8 cm) Weight: 230 lb (104.327 kg) IBW/kg (Calculated) : 73  Temp (24hrs), Avg:100.6 F (38.1 C), Min:99.8 F (37.7 C), Max:101.3 F (38.5 C)   Recent Labs Lab 09/10/15 2147 09/10/15 2200 09/10/15 2320  WBC 12.4*  --   --   CREATININE 1.36*  --   --   LATICACIDVEN  --  0.85 0.83    Estimated Creatinine Clearance: 48.9 mL/min (by C-G formula based on Cr of 1.36).   2/25 CrCl (n) - 41 ml/min  Allergies  Allergen Reactions  . Penicillins     Has patient had a PCN reaction causing immediate rash, facial/tongue/throat swelling, SOB or lightheadedness with hypotension: No Has patient had a PCN reaction causing severe rash involving mucus membranes or skin necrosis: No Has patient had a PCN reaction that required hospitalization No Has patient had a PCN reaction occurring within the last 10 years: No If all of the above answers are "NO", then may proceed with Cephalosporin use.     Antimicrobials this admission: 2/25   > Vanc >   2/25   > Aztreonam >   2/25   > Levofloxacin >  Dose adjustments this admission:   Microbiology results: 2/25 BCx:   2/25 UCx:      Thank you for allowing pharmacy to be a part of this patient's care.  Maryellen Pile, PharmD 09/11/2015 12:08 AM

## 2015-09-11 NOTE — Progress Notes (Addendum)
Progress Note   Alexander Diaz ZOX:[id number] DOB: 11/15/1928 DOA: 09/10/2015 PCP: Gaspar Garbe, MD   Brief Narrative:   Alexander Diaz is an 82 y.o. male the PMH of hypertension and hyperlipidemia who was admitted for observation on 09/10/15 with a chief complaint of a 48 hour history of fever, chills and myalgias.  Assessment/Plan:   Principal Problem:   Flu-like symptoms with fever, myalgia, rigors and back pain/bacteremia - Follow-up influenza PCR. Given high index of suspicion, would empirically treat with Tamiflu. - Discontinue aztreonam, continue Vancomycin for GPC in blood cultures. - CK WNL, no evidence of rhabdomyolysis. - Hip and lumbar spine films consistent with DDD and osteoarthritis. Check MRI L-spine given bacteremia.  Active Problems:   Hypokalemia - Replete.    Glaucoma - Continue Azopt, Timoptic and Xalatan.    AKI versus chronic kidney disease - Baseline creatinine not known. Creatinine stable, but not significantly improved with IV fluids.    Left ear pain - Continue Ciprodex.    HTN (hypertension) - Continue atenolol.    DVT Prophylaxis - Lovenox ordered.   Family Communication/Anticipated D/C date and plan/Code Status   Family Communication: Daughter at bedside. Disposition Plan/date: Home when stable. Likely will need several more days in the hospital to sort out the etiology of his bacteremia.  Code Status: Full code.   IV Access:    Peripheral IV   Procedures and diagnostic studies:   Dg Chest 2 View  09/10/2015  CLINICAL DATA:  Cough and fever. EXAM: CHEST  2 VIEW COMPARISON:  None. FINDINGS: The cardiomediastinal contours are normal. The lungs are clear. Pulmonary vasculature is normal. No consolidation, pleural effusion, or pneumothorax. No acute osseous abnormalities are seen. IMPRESSION: No acute pulmonary process. Electronically Signed   By: Rubye Oaks M.D.   On: 09/10/2015 22:39   Dg Lumbar Spine Complete  09/10/2015   CLINICAL DATA:  Bilateral lumbosacral back pain, fever. EXAM: LUMBAR SPINE - COMPLETE 4+ VIEW COMPARISON:  None. FINDINGS: Mild broad-based rightward curvature of the spine. No fracture or compression deformity. Disc space narrowing at L4-L5, L5-S1, and L2-L3 with associated endplate spurring. There is facet arthropathy throughout. Atherosclerosis of the abdominal aorta noted. IMPRESSION: Degenerative disc disease and facet arthropathy with mild scoliosis. No acute bony abnormality. Electronically Signed   By: Rubye Oaks M.D.   On: 09/10/2015 22:43   Dg Hip Unilat With Pelvis 2-3 Views Left  09/10/2015  CLINICAL DATA:  Left hip and lumbosacral back pain for 1 day. No known injury. EXAM: DG HIP (WITH OR WITHOUT PELVIS) 2-3V LEFT COMPARISON:  None. FINDINGS: The cortical margins of the bony pelvis and left hip are intact. No fracture. Pubic symphysis and sacroiliac joints are congruent. Both femoral heads are well-seated in the respective acetabula. Minimal acetabular spurring bilaterally. No destructive bony change. Scattered enthesopathic changes are noted. 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  No acute bony abnormality. Electronically Signed   By: Rubye Oaks M.D.   On: 09/10/2015 22:41     Medical Consultants:    None.  Anti-Infectives:   Levaquin 09/11/15--->09/11/15 Vancomycin 09/11/15---> Tamiflu 09/11/15--->09/11/15 Aztreonam  09/11/15--->09/11/15   Subjective:   Alexander Diaz has some lower abdominal pain and back pain.  No dyspnea, cough, dysuria.    Objective:    Filed Vitals:   09/10/15 2342 09/10/15 2345 09/11/15 0215 09/11/15 0525  BP:  142/62 169/66 126/56  Pulse:  68 79 77  Temp:   98.7 F (37.1 C) 98.7 F (37.1 C)  TempSrc:   Oral Oral  Resp:  Height:  (1.778 m)     Weight: 104.327 kg (230 lb)     SpO2:  98% 96% 94%    Intake/Output Summary (Last 24 hours) at 09/11/15 1139 Last data filed at 09/11/15 0831  Gross per 24 hour  Intake       0 ml  Output    250 ml  Net   -250 ml   Filed Weights   09/10/15 2342  Weight: 104.327 kg (230 lb)    Exam: Gen:  NAD Cardiovascular:  RRR, No M/R/G Respiratory:  Lungs CTAB Gastrointestinal:  Abdomen soft, tender lower abdomen, + BS Extremities:  2-3+ edema, worse on right   Data Reviewed:    Labs: Basic Metabolic Panel:  Recent Labs Lab 09/10/15 2147 09/11/15 0423  NA 134* 137  K 3.8 3.4*  CL 103 105  CO2 21* 22  GLUCOSE 132* 100*  BUN 25* 25*  CREATININE 1.36* 1.32*  CALCIUM 9.1 8.5*   GFR Estimated Creatinine Clearance: 50.4 mL/min (by C-G formula based on Cr of 1.32). Liver Function Tests: No results for input(s): AST, ALT, ALKPHOS, BILITOT, PROT, ALBUMIN in the last 168 hours. No results for input(s): LIPASE, AMYLASE in the last 168 hours. No results for input(s): AMMONIA in the last 168 hours. Coagulation profile No results for input(s): INR, PROTIME in the last 168 hours.  CBC:  Recent Labs Lab 09/10/15 2147 09/11/15 0423  WBC 12.4* 10.4  NEUTROABS 10.6*  --   HGB 13.6 12.6*  HCT 39.7 37.6*  MCV 89.2 90.2  PLT 141* 137*   Cardiac Enzymes:  Recent Labs Lab 09/10/15 2157  CKTOTAL 68   Sepsis Labs:  Recent Labs Lab 09/10/15 2147 09/10/15 2200 09/10/15 2320 09/11/15 0423  WBC 12.4*  --   --  10.4  LATICACIDVEN  --  0.85 0.83  --    Microbiology Recent Results (from the past 240 hour(s))  Culture, blood (routine x 2)     Status: None (Preliminary result)   Collection Time: 09/10/15  9:47 PM  Result Value Ref Range Status   Specimen Description BLOOD BLOOD RIGHT FOREARM  Final   Special Requests BOTTLES DRAWN AEROBIC AND ANAEROBIC 5 ML  Final   Culture  Setup Time   Final    GRAM POSITIVE COCCI IN PAIRS IN CHAINS IN BOTH AEROBIC AND ANAEROBIC BOTTLES CRITICAL RESULT CALLED TO, READ BACK BY AND VERIFIED WITH: A PRIDDY,RN AT 1128 09/11/15 BY L BENFIELD    Culture   Final    GRAM POSITIVE COCCI Performed at [facility]     Report Status PENDING  Incomplete  Culture, blood (routine x 2)     Status: None (Preliminary result)   Collection Time: 09/10/15  9:51 PM  Result Value Ref Range Status   Specimen Description BLOOD RIGHT HAND  Final   Special Requests BOTTLES DRAWN AEROBIC AND ANAEROBIC 5 ML  Final   Culture  Setup Time   Final    GRAM POSITIVE COCCI IN PAIRS IN CHAINS IN BOTH AEROBIC AND ANAEROBIC BOTTLES CRITICAL RESULT CALLED TO, READ BACK BY AND VERIFIED WITH: A PRIDDY,RN AT 1128 09/11/15 BY L BENFIELD    Culture   Final    GRAM POSITIVE COCCI Performed at [facility]    Report Status PENDING  Incomplete     Medications:   . aspirin EC  81 mg Oral Daily  . atenolol  50  mg Oral Daily  . brinzolamide  1 drop Both Eyes TID  . enoxaparin (LOVENOX) injection  40 mg Subcutaneous Q24H  . latanoprost  1 drop Both Eyes QHS  . multivitamin with minerals  1 tablet Oral Daily  . oseltamivir  30 mg Oral BID  . potassium chloride  20 mEq Oral BID  . terazosin  5 mg Oral BID  . timolol  1 drop Both Eyes QHS   Continuous Infusions: . dextrose 5 % and 0.45 % NaCl with KCl 20 mEq/L 100 mL/hr at 09/11/15 0246    Time spent: 35 minutes with > 50% of time discussing current diagnostic test results, clinical impression and plan of care.     Teniqua Marron  [facility] Pager [phone]. If unable to reach me by pager, please call my cell phone at [phone].  *Please refer to [email or web address], password [email or web address] to get updated schedule on who will round on this patient, as hospitalists switch teams weekly. If 7PM-7AM, please contact night-coverage at [email or web address], password [email or web address] for any overnight needs.  09/11/2015, 11:39 AM

## 2015-09-12 ENCOUNTER — Inpatient Hospital Stay (HOSPITAL_COMMUNITY): Payer: Medicare Other

## 2015-09-12 DIAGNOSIS — M4646 Discitis, unspecified, lumbar region: Secondary | ICD-10-CM | POA: Diagnosis present

## 2015-09-12 DIAGNOSIS — M009 Pyogenic arthritis, unspecified: Secondary | ICD-10-CM

## 2015-09-12 LAB — COMPREHENSIVE METABOLIC PANEL
ALT: 26 U/L (ref 17–63)
ANION GAP: 7 (ref 5–15)
AST: 21 U/L (ref 15–41)
Albumin: 3 g/dL — ABNORMAL LOW (ref 3.5–5.0)
Alkaline Phosphatase: 46 U/L (ref 38–126)
BUN: 15 mg/dL (ref 6–20)
CHLORIDE: 105 mmol/L (ref 101–111)
CO2: 22 mmol/L (ref 22–32)
CREATININE: 0.98 mg/dL (ref 0.61–1.24)
Calcium: 8.7 mg/dL — ABNORMAL LOW (ref 8.9–10.3)
Glucose, Bld: 128 mg/dL — ABNORMAL HIGH (ref 65–99)
Potassium: 3.9 mmol/L (ref 3.5–5.1)
SODIUM: 134 mmol/L — AB (ref 135–145)
Total Bilirubin: 0.7 mg/dL (ref 0.3–1.2)
Total Protein: 6.4 g/dL — ABNORMAL LOW (ref 6.5–8.1)

## 2015-09-12 LAB — CBC
HCT: 38.2 % — ABNORMAL LOW (ref 39.0–52.0)
HEMOGLOBIN: 12.8 g/dL — AB (ref 13.0–17.0)
MCH: 30 pg (ref 26.0–34.0)
MCHC: 33.5 g/dL (ref 30.0–36.0)
MCV: 89.5 fL (ref 78.0–100.0)
PLATELETS: 139 10*3/uL — AB (ref 150–400)
RBC: 4.27 MIL/uL (ref 4.22–5.81)
RDW: 15.2 % (ref 11.5–15.5)
WBC: 9.8 10*3/uL (ref 4.0–10.5)

## 2015-09-12 LAB — URINE CULTURE: CULTURE: NO GROWTH

## 2015-09-12 MED ORDER — GADOBENATE DIMEGLUMINE 529 MG/ML IV SOLN
20.0000 mL | Freq: Once | INTRAVENOUS | Status: AC | PRN
  Administered 2015-09-12: 20 mL via INTRAVENOUS

## 2015-09-12 MED ORDER — HYDRALAZINE HCL 20 MG/ML IJ SOLN
5.0000 mg | Freq: Once | INTRAMUSCULAR | Status: AC
  Administered 2015-09-12: 5 mg via INTRAVENOUS
  Filled 2015-09-12: qty 1

## 2015-09-12 NOTE — Progress Notes (Signed)
Patient got up to chair with 2 assist and walker.  Chair alarm on.

## 2015-09-12 NOTE — Progress Notes (Signed)
Progress Note   Alexander Diaz ZOX:[id number] DOB: 19-Feb-1929 DOA: 09/10/2015 PCP: Gaspar Garbe, MD   Brief Narrative:   Alexander Diaz is an 82 y.o. male the PMH of hypertension and hyperlipidemia who was admitted for observation on 09/10/15 with a chief complaint of a 48 hour history of fever, chills and myalgias.  Assessment/Plan:   Principal Problem:   Bacteremia and septic left L3-4 facet joint with early adjacent epidural inflammation - Influenza negative, empiric Tamiflu discontinued 09/11/15. - Continue Vancomycin for GPC in blood cultures. - Hip and lumbar spine films consistent with DDD and osteoarthritis. Follow-up MRI showed septic left L3-L4 joint. - Repeat blood cultures.  When clear, will need PICC & 6 weeks of antibiotics.  Active Problems:   Hypokalemia - Repleted.    Glaucoma - Continue Azopt, Timoptic and Xalatan.    AKI  - Baseline creatinine not known. Creatinine improved with IV fluids.    Left ear pain - Continue Ciprodex.    HTN (hypertension) - Continue atenolol.    DVT Prophylaxis - Lovenox ordered.   Family Communication/Anticipated D/C date and plan/Code Status   Family Communication: Daughter at bedside. Disposition Plan/date: Home when stable.Will need several more days in the hospital for PICC once blood cultures cleared, 2 D Echo, etc.  Code Status: Full code.   IV Access:    Peripheral IV   Procedures and diagnostic studies:   Dg Chest 2 View  09/10/2015  CLINICAL DATA:  Cough and fever. EXAM: CHEST  2 VIEW COMPARISON:  None. FINDINGS: The cardiomediastinal contours are normal. The lungs are clear. Pulmonary vasculature is normal. No consolidation, pleural effusion, or pneumothorax. No acute osseous abnormalities are seen. IMPRESSION: No acute pulmonary process. Electronically Signed   By: Rubye Oaks M.D.   On: 09/10/2015 22:39   Dg Lumbar Spine Complete  09/10/2015  CLINICAL DATA:  Bilateral lumbosacral back pain,  fever. EXAM: LUMBAR SPINE - COMPLETE 4+ VIEW COMPARISON:  None. FINDINGS: Mild broad-based rightward curvature of the spine. No fracture or compression deformity. Disc space narrowing at L4-L5, L5-S1, and L2-L3 with associated endplate spurring. There is facet arthropathy throughout. Atherosclerosis of the abdominal aorta noted. IMPRESSION: Degenerative disc disease and facet arthropathy with mild scoliosis. No acute bony abnormality. Electronically Signed   By: Rubye Oaks M.D.   On: 09/10/2015 22:43   Dg Hip Unilat With Pelvis 2-3 Views Left  09/10/2015  CLINICAL DATA:  Left hip and lumbosacral back pain for 1 day. No known injury. EXAM: DG HIP (WITH OR WITHOUT PELVIS) 2-3V LEFT COMPARISON:  None. FINDINGS: The cortical margins of the bony pelvis and left hip are intact. No fracture. Pubic symphysis and sacroiliac joints are congruent. Both femoral heads are well-seated in the respective acetabula. Minimal acetabular spurring bilaterally. No destructive bony change. Scattered enthesopathic changes are noted. 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  No acute bony abnormality. Electronically Signed   By: Rubye Oaks M.D.   On: 09/10/2015 22:41     Medical Consultants:    ID  Anti-Infectives:   Levaquin 09/11/15--->09/11/15 Vancomycin 09/11/15---> Tamiflu 09/11/15--->09/11/15 Aztreonam  09/11/15--->09/11/15   Subjective:   Alexander Diaz feels better today.  Back/abdominal pain improved.  No reports of N/V.  Sitting up in chair.  Appetite good.   Objective:    Filed Vitals:   09/12/15 0342 09/12/15 0607 09/12/15 0639 09/12/15 1506  BP: 165/70 162/58 173/64 175/59  Pulse: 72 67 88 70  Temp: 98.9 F (37.2 C) 98.2 F (36.8 C)  98.3 F (36.8 C)  TempSrc: Oral Oral  Oral  Resp:  17  16  Height:      Weight:      SpO2: 95% 94%  96%    Intake/Output Summary (Last 24 hours) at 09/12/15 1528 Last data filed at 09/12/15 1506  Gross per 24 hour  Intake    760 ml  Output   1225 ml    Net   -465 ml   Filed Weights   09/10/15 2342  Weight: 104.327 kg (230 lb)    Exam: Gen:  NAD Cardiovascular:  RRR, No M/R/G Respiratory:  Lungs CTAB Gastrointestinal:  Abdomen soft, NT/ND, + BS Extremities:  2-3+ edema, worse on right   Data Reviewed:    Labs: Basic Metabolic Panel:  Recent Labs Lab 09/10/15 2147 09/11/15 0423 09/12/15 0421  NA 134* 137 134*  K 3.8 3.4* 3.9  CL 103 105 105  CO2 21* 22 22  GLUCOSE 132* 100* 128*  BUN 25* 25* 15  CREATININE 1.36* 1.32* 0.98  CALCIUM 9.1 8.5* 8.7*   GFR Estimated Creatinine Clearance: 67.9 mL/min (by C-G formula based on Cr of 0.98). Liver Function Tests:  Recent Labs Lab 09/12/15 0421  AST 21  ALT 26  ALKPHOS 46  BILITOT 0.7  PROT 6.4*  ALBUMIN 3.0*   CBC:  Recent Labs Lab 09/10/15 2147 09/11/15 0423 09/12/15 0421  WBC 12.4* 10.4 9.8  NEUTROABS 10.6*  --   --   HGB 13.6 12.6* 12.8*  HCT 39.7 37.6* 38.2*  MCV 89.2 90.2 89.5  PLT 141* 137* 139*   Cardiac Enzymes:  Recent Labs Lab 09/10/15 2157  CKTOTAL 68   Sepsis Labs:  Recent Labs Lab 09/10/15 2147 09/10/15 2200 09/10/15 2320 09/11/15 0423 09/12/15 0421  WBC 12.4*  --   --  10.4 9.8  LATICACIDVEN  --  0.85 0.83  --   --    Microbiology Recent Results (from the past 240 hour(s))  Culture, blood (routine x 2)     Status: None (Preliminary result)   Collection Time: 09/10/15  9:47 PM  Result Value Ref Range Status   Specimen Description BLOOD BLOOD RIGHT FOREARM  Final   Special Requests BOTTLES DRAWN AEROBIC AND ANAEROBIC 5 ML  Final   Culture  Setup Time   Final    GRAM POSITIVE COCCI IN PAIRS IN CHAINS IN BOTH AEROBIC AND ANAEROBIC BOTTLES CRITICAL RESULT CALLED TO, READ BACK BY AND VERIFIED WITH: A PRIDDY,RN AT 1128 09/11/15 BY L BENFIELD    Culture   Final    GRAM POSITIVE COCCI IDENTIFICATION AND SUSCEPTIBILITIES TO FOLLOW Performed at [facility]    Report Status PENDING  Incomplete  Culture, blood  (routine x 2)     Status: None (Preliminary result)   Collection Time: 09/10/15  9:51 PM  Result Value Ref Range Status   Specimen Description BLOOD RIGHT HAND  Final   Special Requests BOTTLES DRAWN AEROBIC AND ANAEROBIC 5 ML  Final   Culture  Setup Time   Final    GRAM POSITIVE COCCI IN PAIRS IN CHAINS IN BOTH AEROBIC AND ANAEROBIC BOTTLES CRITICAL RESULT CALLED TO, READ BACK BY AND VERIFIED WITH: A PRIDDY,RN AT 1128 09/11/15 BY L BENFIELD    Culture   Final    GRAM POSITIVE COCCI Performed at [facility]    Report Status PENDING  Incomplete  Urine culture     Status: None   Collection Time: 09/10/15 10:50 PM  Result  Value Ref Range Status   Specimen Description URINE, RANDOM  Final   Special Requests NONE  Final   Culture   Final    NO GROWTH 1 DAY Performed at [facility]    Report Status 09/12/2015 FINAL  Final     Medications:   . aspirin EC  81 mg Oral Daily  . atenolol  50 mg Oral Daily  . brinzolamide  1 drop Both Eyes TID  . enoxaparin (LOVENOX) injection  40 mg Subcutaneous Q24H  . latanoprost  1 drop Both Eyes QHS  . multivitamin with minerals  1 tablet Oral Daily  . terazosin  5 mg Oral BID  . timolol  1 drop Both Eyes QHS  . vancomycin  1,250 mg Intravenous Q24H   Continuous Infusions: . dextrose 5 % and 0.45 % NaCl with KCl 20 mEq/L 100 mL/hr (09/12/15 0354)    Time spent: 35 minutes with > 50% of time discussing current diagnostic test results, clinical impression and plan of care.   LOS: 1 day   Alexander Diaz  [facility] Pager [phone]. If unable to reach me by pager, please call my cell phone at [phone].  *Please refer to [email or web address], password [email or web address] to get updated schedule on who will round on this patient, as hospitalists switch teams weekly. If 7PM-7AM, please contact night-coverage at [email or web address], password [email or web address] for any overnight needs.  09/12/2015, 3:28 PM

## 2015-09-12 NOTE — Progress Notes (Signed)
BP 173/64 HR88 BP 's have been 160's/60,70's Afebrile.denies any pain Still waiting to have MRI done.Text info to [place] NP [facility]. Linward Headland D

## 2015-09-13 LAB — CULTURE, BLOOD (ROUTINE X 2)

## 2015-09-13 LAB — BASIC METABOLIC PANEL
Anion gap: 10 (ref 5–15)
BUN: 14 mg/dL (ref 6–20)
CHLORIDE: 104 mmol/L (ref 101–111)
CO2: 21 mmol/L — AB (ref 22–32)
Calcium: 9.1 mg/dL (ref 8.9–10.3)
Creatinine, Ser: 0.86 mg/dL (ref 0.61–1.24)
GFR calc Af Amer: >60 mL/min (ref >60)
GLUCOSE: 123 mg/dL — AB (ref 65–99)
POTASSIUM: 3.6 mmol/L (ref 3.5–5.1)
Sodium: 135 mmol/L (ref 135–145)

## 2015-09-13 MED ORDER — KETOROLAC TROMETHAMINE 30 MG/ML IJ SOLN: 30.0000 mg | Freq: Once | INTRAMUSCULAR | Status: AC | PRN

## 2015-09-13 MED ORDER — VANCOMYCIN HCL IN DEXTROSE 750-5 MG/150ML-% IV SOLN
750.0000 mg | Freq: Two times a day (BID) | INTRAVENOUS | Status: DC
  Administered 2015-09-13 – 2015-09-14 (×3): 750 mg via INTRAVENOUS
  Filled 2015-09-13 (×3): qty 150

## 2015-09-13 MED ORDER — ATORVASTATIN CALCIUM 40 MG PO TABS
40.0000 mg | ORAL_TABLET | Freq: Every day | ORAL | Status: DC
  Administered 2015-09-13 – 2015-09-17 (×5): 40 mg via ORAL
  Filled 2015-09-13 (×5): qty 1

## 2015-09-13 MED ORDER — HYDRALAZINE HCL 20 MG/ML IJ SOLN
10.0000 mg | Freq: Once | INTRAMUSCULAR | Status: AC
  Administered 2015-09-13: 10 mg via INTRAVENOUS

## 2015-09-13 MED ORDER — HYDRALAZINE HCL 20 MG/ML IJ SOLN
INTRAMUSCULAR | Status: AC
  Filled 2015-09-13: qty 1

## 2015-09-13 MED ORDER — ATENOLOL 50 MG PO TABS
50.0000 mg | ORAL_TABLET | Freq: Two times a day (BID) | ORAL | Status: DC
  Administered 2015-09-13 – 2015-09-17 (×8): 50 mg via ORAL
  Filled 2015-09-13 (×9): qty 1

## 2015-09-13 MED ORDER — HYDRALAZINE HCL 20 MG/ML IJ SOLN
10.0000 mg | Freq: Once | INTRAMUSCULAR | Status: AC
  Administered 2015-09-13: 10 mg via INTRAVENOUS
  Filled 2015-09-13: qty 1

## 2015-09-13 NOTE — Evaluation (Signed)
Physical Therapy Evaluation Patient Details Name: Alexander Diaz MRN: [id number] DOB: April 15, 1929 Today's Date: 09/13/2015   History of Present Illness  Alexander Diaz is an 82 y.o. male the PMH of hypertension and hyperlipidemia who was admitted for observation on 09/10/15 with a chief complaint of a 48 hour history of fever, chills and myalgias. Suspected septic left L3-4 facet joint   Clinical Impression  Patient is requiring max assist of 2 persons and  Unable to safely stand. The Left knee is buckling, strength aopoears ~3-3+ in kne extension. Required  The STEDY for safe r=transfer. Pt admitted with above diagnosis. Pt currently with functional limitations due to the deficits listed below (see PT Problem List). Pt will benefit from skilled PT to increase their independence and safety with mobility to allow discharge to the venue listed below.  '   Follow Up Recommendations SNF;Supervision/Assistance - 24 hourunless improves enough to ambulate.    Equipment Recommendations  None recommended by PT    Recommendations for Other Services       Precautions / Restrictions Precautions Precautions: Fall Precaution Comments: LLE seems weaker      Mobility  Bed Mobility Overal bed mobility: Needs Assistance;+2 for physical assistance;+ 2 for safety/equipment Bed Mobility: Supine to Sit     Supine to sit: Max assist;+2 for physical assistance;+2 for safety/equipment;HOB elevated     General bed mobility comments: extra time for  getting legs over abd  assist with trunk  Transfers Overall transfer level: Needs assistance Equipment used: Rolling walker (2 wheeled) Transfers: Sit to/from Stand Sit to Stand: Max assist;+2 physical assistance;+2 safety/equipment;From elevated surface         General transfer comment: stood x 2  at RW, L knee buckling  so aborted transfer and used stedy  Ambulation/Gait                Stairs            Wheelchair Mobility    Modified  Rankin (Stroke Patients Only)       Balance                                             Pertinent Vitals/Pain Pain Assessment: 0-10 Pain Score: 5  Pain Location: back Pain Descriptors / Indicators: Aching;Tightness Pain Intervention(s): Limited activity within patient's tolerance;Monitored during session;Patient requesting pain meds-RN notified    Home Living Family/patient expects to be discharged to:: Private residence Living Arrangements: Spouse/significant other;Children Available Help at Discharge: Family Type of Home: House Home Access: Stairs to enter   Secretary/administrator of Steps: 2 Home Layout: One level Home Equipment: Walker - 2 wheels      Prior Function Level of Independence: Independent         Comments: works full time.     Hand Dominance        Extremity/Trunk Assessment               Lower Extremity Assessment: LLE deficits/detail   LLE Deficits / Details: knee extension 3/5, knee flexion 3/5,   Cervical / Trunk Assessment: Kyphotic  Communication   Communication: No difficulties  Cognition Arousal/Alertness: Awake/alert   Overall Cognitive Status: Within Functional Limits for tasks assessed                      General Comments      Exercises  Assessment/Plan    PT Assessment Patient needs continued PT services  PT Diagnosis Difficulty walking;Acute pain;Generalized weakness   PT Problem List Decreased strength;Decreased activity tolerance;Decreased mobility;Pain;Decreased knowledge of precautions;Decreased safety awareness;Decreased knowledge of use of DME  PT Treatment Interventions DME instruction;Gait training;Functional mobility training;Therapeutic activities;Therapeutic exercise;Patient/family education   PT Goals (Current goals can be found in the Care Plan section) Acute Rehab PT Goals Patient Stated Goal: to walk PT Goal Formulation: With patient/family Time For Goal  Achievement: 09/27/15 Potential to Achieve Goals: Good    Frequency Min 3X/week   Barriers to discharge Decreased caregiver support      Co-evaluation               End of Session Equipment Utilized During Treatment: Gait belt Activity Tolerance: Patient tolerated treatment well Patient left: in chair;with call bell/phone within reach;with chair alarm set;with family/visitor present Nurse Communication: Mobility status;Need for lift equipment         Time: 1111-1148 PT Time Calculation (min) (ACUTE ONLY): 37 min   Charges:   PT Evaluation $PT Eval Moderate Complexity: 1 Procedure PT Treatments $Therapeutic Activity: 8-22 mins   PT G Codes:        [name] 09/13/2015, 1:44 PM Blanchard Kelch PT [phone]

## 2015-09-13 NOTE — Progress Notes (Signed)
Progress Note   Alexander Diaz ZOX:[id number] DOB: Nov 20, 1928 DOA: 09/10/2015 PCP: Gaspar Garbe, MD   Brief Narrative:   Alexander Diaz is an 82 y.o. male the PMH of hypertension and hyperlipidemia who was admitted for observation on 09/10/15 with a chief complaint of a 48 hour history of fever, chills and myalgias.  Assessment/Plan:   Principal Problem:   Bacteremia and septic left L3-4 facet joint with early adjacent epidural inflammation - Influenza negative, empiric Tamiflu discontinued 09/11/15. - Blood culture growing strep pneumonia bacteria. - Hip and lumbar spine films consistent with DDD and osteoarthritis. Follow-up MRI showed septic left L3-L4 joint. - Repeat blood cultures.  When clear, will need PICC & 6 weeks of antibiotics.  Active Problems:   Hypokalemia - Repleted.    Glaucoma - Continue Azopt, Timoptic and Xalatan.    AKI  - Baseline creatinine not known. Creatinine improved with IV fluids.    Left ear pain - Continue Ciprodex.    HTN (hypertension) - Continue atenolol & Hytrin.  Increase atenolol given elevated BP.  Continue Hydralazine PRN.    DVT Prophylaxis - Lovenox ordered.   Family Communication/Anticipated D/C date and plan/Code Status   Family Communication: Daughter at bedside. Disposition Plan/date: Home when stable.Will need several more days in the hospital for PICC once blood cultures cleared, 2 D Echo, etc.  Code Status: Full code.   IV Access:    Peripheral IV   Procedures and diagnostic studies:   Dg Chest 2 View  09/10/2015  CLINICAL DATA:  Cough and fever. EXAM: CHEST  2 VIEW COMPARISON:  None. FINDINGS: The cardiomediastinal contours are normal. The lungs are clear. Pulmonary vasculature is normal. No consolidation, pleural effusion, or pneumothorax. No acute osseous abnormalities are seen. IMPRESSION: No acute pulmonary process. Electronically Signed   By: Rubye Oaks M.D.   On: 09/10/2015 22:39   Dg Lumbar Spine  Complete  09/10/2015  CLINICAL DATA:  Bilateral lumbosacral back pain, fever. EXAM: LUMBAR SPINE - COMPLETE 4+ VIEW COMPARISON:  None. FINDINGS: Mild broad-based rightward curvature of the spine. No fracture or compression deformity. Disc space narrowing at L4-L5, L5-S1, and L2-L3 with associated endplate spurring. There is facet arthropathy throughout. Atherosclerosis of the abdominal aorta noted. IMPRESSION: Degenerative disc disease and facet arthropathy with mild scoliosis. No acute bony abnormality. Electronically Signed   By: Rubye Oaks M.D.   On: 09/10/2015 22:43   Dg Hip Unilat With Pelvis 2-3 Views Left  09/10/2015  CLINICAL DATA:  Left hip and lumbosacral back pain for 1 day. No known injury. EXAM: DG HIP (WITH OR WITHOUT PELVIS) 2-3V LEFT COMPARISON:  None. FINDINGS: The cortical margins of the bony pelvis and left hip are intact. No fracture. Pubic symphysis and sacroiliac joints are congruent. Both femoral heads are well-seated in the respective acetabula. Minimal acetabular spurring bilaterally. No destructive bony change. Scattered enthesopathic changes are noted. 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  No acute bony abnormality. Electronically Signed   By: Rubye Oaks M.D.   On: 09/10/2015 22:41     Medical Consultants:    ID  Anti-Infectives:   Levaquin 09/11/15--->09/11/15 Vancomycin 09/11/15---> Tamiflu 09/11/15--->09/11/15 Aztreonam  09/11/15--->09/11/15   Subjective:   Alexander Diaz has a bit of chest congestion.  Recent left otitis externa, no current complaints of ear pain.  No recent URI. Has back pain with movement.   Objective:    Filed Vitals:   09/13/15 0229 09/13/15 0247 09/13/15 0400 09/13/15 0631  BP: 174/71 171/66 156/66  173/64  Pulse: 72 70 68 71  Temp:    98.5 F (36.9 C)  TempSrc:    Oral  Resp:    19  Height:      Weight:      SpO2:    97%    Intake/Output Summary (Last 24 hours) at 09/13/15 0855 Last data filed at 09/13/15 0800   Gross per 24 hour  Intake    720 ml  Output    825 ml  Net   -105 ml   Filed Weights   09/10/15 2342  Weight: 104.327 kg (230 lb)    Exam: Gen:  NAD Cardiovascular:  RRR, No M/R/G Respiratory:  Lungs CTAB Gastrointestinal:  Abdomen soft, NT/ND, + BS Extremities:  2-3+ edema, worse on right   Data Reviewed:    Labs: Basic Metabolic Panel:  Recent Labs Lab 09/10/15 2147 09/11/15 0423 09/12/15 0421 09/13/15 0417  NA 134* 137 134* 135  K 3.8 3.4* 3.9 3.6  CL 103 105 105 104  CO2 21* 22 22 21*  GLUCOSE 132* 100* 128* 123*  BUN 25* 25* 15 14  CREATININE 1.36* 1.32* 0.98 0.86  CALCIUM 9.1 8.5* 8.7* 9.1   GFR Estimated Creatinine Clearance: 77.3 mL/min (by C-G formula based on Cr of 0.86). Liver Function Tests:  Recent Labs Lab 09/12/15 0421  AST 21  ALT 26  ALKPHOS 46  BILITOT 0.7  PROT 6.4*  ALBUMIN 3.0*   CBC:  Recent Labs Lab 09/10/15 2147 09/11/15 0423 09/12/15 0421  WBC 12.4* 10.4 9.8  NEUTROABS 10.6*  --   --   HGB 13.6 12.6* 12.8*  HCT 39.7 37.6* 38.2*  MCV 89.2 90.2 89.5  PLT 141* 137* 139*   Cardiac Enzymes:  Recent Labs Lab 09/10/15 2157  CKTOTAL 68   Sepsis Labs:  Recent Labs Lab 09/10/15 2147 09/10/15 2200 09/10/15 2320 09/11/15 0423 09/12/15 0421  WBC 12.4*  --   --  10.4 9.8  LATICACIDVEN  --  0.85 0.83  --   --    Microbiology Recent Results (from the past 240 hour(s))  Culture, blood (routine x 2)     Status: None (Preliminary result)   Collection Time: 09/10/15  9:47 PM  Result Value Ref Range Status   Specimen Description BLOOD BLOOD RIGHT FOREARM  Final   Special Requests BOTTLES DRAWN AEROBIC AND ANAEROBIC 5 ML  Final   Culture  Setup Time   Final    GRAM POSITIVE COCCI IN PAIRS IN CHAINS IN BOTH AEROBIC AND ANAEROBIC BOTTLES CRITICAL RESULT CALLED TO, READ BACK BY AND VERIFIED WITH: A PRIDDY,RN AT 1128 09/11/15 BY L BENFIELD    Culture   Final    GRAM POSITIVE COCCI IDENTIFICATION AND SUSCEPTIBILITIES TO  FOLLOW Performed at [facility]    Report Status PENDING  Incomplete  Culture, blood (routine x 2)     Status: None (Preliminary result)   Collection Time: 09/10/15  9:51 PM  Result Value Ref Range Status   Specimen Description BLOOD RIGHT HAND  Final   Special Requests BOTTLES DRAWN AEROBIC AND ANAEROBIC 5 ML  Final   Culture  Setup Time   Final    GRAM POSITIVE COCCI IN PAIRS IN CHAINS IN BOTH AEROBIC AND ANAEROBIC BOTTLES CRITICAL RESULT CALLED TO, READ BACK BY AND VERIFIED WITH: A PRIDDY,RN AT 1128 09/11/15 BY L BENFIELD    Culture   Final    STREPTOCOCCUS PNEUMONIAE SUSCEPTIBILITIES TO FOLLOW Performed at [facility]  Report Status PENDING  Incomplete  Urine culture     Status: None   Collection Time: 09/10/15 10:50 PM  Result Value Ref Range Status   Specimen Description URINE, RANDOM  Final   Special Requests NONE  Final   Culture   Final    NO GROWTH 1 DAY Performed at [facility]    Report Status 09/12/2015 FINAL  Final     Medications:   . aspirin EC  81 mg Oral Daily  . atenolol  50 mg Oral Daily  . brinzolamide  1 drop Both Eyes TID  . enoxaparin (LOVENOX) injection  40 mg Subcutaneous Q24H  . hydrALAZINE      . latanoprost  1 drop Both Eyes QHS  . multivitamin with minerals  1 tablet Oral Daily  . terazosin  5 mg Oral BID  . timolol  1 drop Both Eyes QHS  . vancomycin  750 mg Intravenous Q12H   Continuous Infusions: . dextrose 5 % and 0.45 % NaCl with KCl 20 mEq/L 10 mL/hr (09/13/15 0110)    Time spent: 35 minutes with > 50% of time discussing current diagnostic test results, clinical impression and plan of care.   LOS: 2 days   RAMA,CHRISTINA  [facility] Pager [phone]. If unable to reach me by pager, please call my cell phone at [phone].  *Please refer to [email or web address], password [email or web address] to get updated schedule on who will round on this patient, as hospitalists switch teams weekly. If 7PM-7AM, please contact  night-coverage at [email or web address], password [email or web address] for any overnight needs.  09/13/2015, 8:55 AM

## 2015-09-13 NOTE — Consult Note (Signed)
          [facility]     Date of Admission:  09/10/2015   Total days of antibiotics 4  Patient Active Problem List   Diagnosis Date Noted  . Septic joint (HCC), left L3-4 with early adjacent epidural inflammation 09/12/2015    Priority: High  . Pneumococcal bacteremia 09/11/2015    Priority: High  . Acute pain of left ear 09/13/2015  . Fever 09/11/2015  . Myalgia 09/11/2015  . Rigors 09/11/2015  . Back pain 09/11/2015  . HTN (hypertension) 09/11/2015  . Febrile illness 09/11/2015  . Flu-like symptoms 09/11/2015  . Glaucoma 09/11/2015  . Hypokalemia 09/11/2015  . Left hip pain   . Otitis externa   . Bilateral low back pain without sciatica    Plan: 1. 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 The pain got much worse last week and he told his daughter that he felt like he was developing the flu. He also developed acute on chronic low back pain that became so bad that he could no longer walk leading to admission 4 days ago. He was febrile on admission have his left tympanic membrane was noted to be ruptured. Admission blood cultures have grown pneumococcus sensitive to all antibiotics tested. MRI reveals findings suspicious for a septic left L3-4 facet joint with early adjacent epidural inflammation. There are no acute abnormalities on his chest x-ray. He has a history of rash following penicillin. He is feeling better on IV vancomycin but is still in too much pain to do much activity with the physical therapists. He is currently worn out after attempting physical therapy and is sound asleep. I did not attempt to wake him. I will return tomorrow for a full evaluation.        Cliffton Asters, MD [facility] [phone] pager   [phone] cell 07/20/2015, 1:32 PM

## 2015-09-13 NOTE — Progress Notes (Signed)
Karduk NP notified of BP 197/75 HR 66 see orders. Linward Headland D

## 2015-09-13 NOTE — Progress Notes (Signed)
BP 156/66 HR 68 sleeping, pain relief of left hip pain after Tylenol given earlier. See MAR and VS flow sheet for other BP's prior to this time Alexander Diaz

## 2015-09-13 NOTE — Progress Notes (Addendum)
Pharmacy Antibiotic Note  Alexander Diaz is a 82 y.o. male admitted on 09/10/2015 with sepsis 2/2 GPC bacteremia.  Pharmacy currently following for Vancomycin dosing.  Patient presented with left ear drainage, fever, chills and hip & lower back pain.  Blood cultures growing Streptococcus pneumoniae.  Plan:  Adjust vancomycin to 750 mg IV q12h for improved SCr.  Would consider the addition of a beta-lactam until sensitivities result.  Height:  (177.8 cm) Weight: 230 lb (104.327 kg) IBW/kg (Calculated) : 73  Temp (24hrs), Avg:98.2 F (36.8 C), Min:97.7 F (36.5 C), Max:98.5 F (36.9 C)   Recent Labs Lab 09/10/15 2147 09/10/15 2200 09/10/15 2320 09/11/15 0423 09/12/15 0421 09/13/15 0417  WBC 12.4*  --   --  10.4 9.8  --   CREATININE 1.36*  --   --  1.32* 0.98 0.86  LATICACIDVEN  --  0.85 0.83  --   --   --     Estimated Creatinine Clearance: 77.3 mL/min (by C-G formula based on Cr of 0.86).   2/25 CrCl (n) - 41 ml/min  Allergies  Allergen Reactions  . Penicillins     Has patient had a PCN reaction causing immediate rash, facial/tongue/throat swelling, SOB or lightheadedness with hypotension: No Has patient had a PCN reaction causing severe rash involving mucus membranes or skin necrosis: No Has patient had a PCN reaction that required hospitalization No Has patient had a PCN reaction occurring within the last 10 years: No If all of the above answers are "NO", then may proceed with Cephalosporin use.     Antimicrobials this admission: 2/25  Vanc >>   2/25 Azactam >>  2/25 2/25 Levaquin >> 2/25 2/25 Tamiflu >> 2/25  Dose adjustments this admission: 2/27 adjust vanc 1250 mg q24h to 750 mg q12h for improved SCr (obese dosing nomogram)   Microbiology results: 2/25 BCx x2: 2/2 Strep Pneumo (susceptibilities pending) 2/26 BCx x2: sent 2/25 UCx:   NGF 2/25 influ PCR (-)   Thank you for allowing pharmacy to be a part of this patient's care.  Clance Boll,  PharmD 09/13/2015 7:38 AM

## 2015-09-14 DIAGNOSIS — M869 Osteomyelitis, unspecified: Secondary | ICD-10-CM

## 2015-09-14 DIAGNOSIS — H9202 Otalgia, left ear: Secondary | ICD-10-CM

## 2015-09-14 DIAGNOSIS — M4636 Infection of intervertebral disc (pyogenic), lumbar region: Secondary | ICD-10-CM

## 2015-09-14 DIAGNOSIS — B953 Streptococcus pneumoniae as the cause of diseases classified elsewhere: Secondary | ICD-10-CM

## 2015-09-14 DIAGNOSIS — H6692 Otitis media, unspecified, left ear: Secondary | ICD-10-CM

## 2015-09-14 DIAGNOSIS — M4626 Osteomyelitis of vertebra, lumbar region: Secondary | ICD-10-CM | POA: Diagnosis present

## 2015-09-14 LAB — BASIC METABOLIC PANEL
ANION GAP: 8 (ref 5–15)
BUN: 20 mg/dL (ref 6–20)
CHLORIDE: 105 mmol/L (ref 101–111)
CO2: 24 mmol/L (ref 22–32)
Calcium: 9.4 mg/dL (ref 8.9–10.3)
Creatinine, Ser: 0.93 mg/dL (ref 0.61–1.24)
GFR calc Af Amer: >60 mL/min (ref >60)
Glucose, Bld: 124 mg/dL — ABNORMAL HIGH (ref 65–99)
POTASSIUM: 3.8 mmol/L (ref 3.5–5.1)
SODIUM: 137 mmol/L (ref 135–145)

## 2015-09-14 MED ORDER — LEVOFLOXACIN 750 MG PO TABS
750.0000 mg | ORAL_TABLET | Freq: Every day | ORAL | Status: DC
  Administered 2015-09-14 – 2015-09-17 (×4): 750 mg via ORAL
  Filled 2015-09-14 (×6): qty 1

## 2015-09-14 MED ORDER — HYDROCODONE-ACETAMINOPHEN 5-325 MG PO TABS: 1.0000 | ORAL_TABLET | ORAL | Status: DC | PRN

## 2015-09-14 MED ORDER — HYDROCODONE-ACETAMINOPHEN 5-325 MG PO TABS
1.0000 | ORAL_TABLET | Freq: Three times a day (TID) | ORAL | Status: DC
  Administered 2015-09-14 – 2015-09-17 (×10): 1 via ORAL
  Filled 2015-09-14 (×10): qty 1

## 2015-09-14 MED ORDER — POLYETHYLENE GLYCOL 3350 17 G PO PACK
17.0000 g | PACK | Freq: Every day | ORAL | Status: DC
  Administered 2015-09-14 – 2015-09-17 (×4): 17 g via ORAL

## 2015-09-14 NOTE — Progress Notes (Signed)
CSW met with pt / daughter to review PT recommendations. Daughter reports that pt has 24/7 support at home and would like [facility] services at d/c. RNCM has been updated and will assist with d/c planning.  Werner Lean LCSW [phone]

## 2015-09-14 NOTE — Progress Notes (Signed)
Patient ID: Alexander Diaz, male   DOB: May 12, 1929, 82 y.o.   MRN: [id number]         [facility]    Date of Admission:  09/10/2015           Day 3 vancomycin  Principal Problem:   Pneumococcal bacteremia Active Problems:   Septic joint (HCC), left L3-4 with early adjacent epidural inflammation   Myalgia   Rigors   HTN (hypertension)   Glaucoma   Hypokalemia   Acute pain of left ear   . aspirin EC  81 mg Oral Daily  . atenolol  50 mg Oral BID  . atorvastatin  40 mg Oral Daily  . brinzolamide  1 drop Both Eyes TID  . enoxaparin (LOVENOX) injection  40 mg Subcutaneous Q24H  . HYDROcodone-acetaminophen  1 tablet Oral TID  . latanoprost  1 drop Both Eyes QHS  . multivitamin with minerals  1 tablet Oral Daily  . polyethylene glycol  17 g Oral Daily  . terazosin  5 mg Oral BID  . timolol  1 drop Both Eyes QHS  . vancomycin  750 mg Intravenous Q12H    SUBJECTIVE: He is still having severe back pain that limits his ability to get out of bed but overall he is feeling much better. He is no longer having any left ear pain or drainage. He developed diffuse, severe hives after receiving penicillin injections in 1953. He does not recall if he has ever taken any other beta-lactam antibiotics since.  Review of Systems: Review of Systems  Constitutional: Positive for malaise/fatigue. Negative for fever, chills, weight loss and diaphoresis.  HENT: Negative for sore throat.   Respiratory: Negative for cough, sputum production and shortness of breath.   Cardiovascular: Negative for chest pain.  Gastrointestinal: Negative for nausea, vomiting and diarrhea.  Musculoskeletal: Positive for back pain. Negative for myalgias and joint pain.  Skin: Negative for rash.  Neurological: Negative for headaches.    Past Medical History  Diagnosis Date  . Hypertension   . Hypercholesteremia   . Glaucoma   . DDD (degenerative disc disease), lumbar   . Osteoarthritis      Social History  Substance Use Topics  . Smoking status: Former Smoker    Quit date: 09/09/1953  . Smokeless tobacco: Former Neurosurgeon    Types: Chew    Quit date: 09/09/1953  . Alcohol Use: Yes     Comment: every day 4 oz (bourbon) before dinner    History reviewed. No pertinent family history. Allergies  Allergen Reactions  . Penicillins     Has patient had a PCN reaction causing immediate rash, facial/tongue/throat swelling, SOB or lightheadedness with hypotension: No Has patient had a PCN reaction causing severe rash involving mucus membranes or skin necrosis: No Has patient had a PCN reaction that required hospitalization No Has patient had a PCN reaction occurring within the last 10 years: No If all of the above answers are "NO", then may proceed with Cephalosporin use.     OBJECTIVE: Filed Vitals:   09/13/15 2204 09/14/15 0606 09/14/15 1046 09/14/15 1359  BP: 156/71 208/85 129/49 155/68  Pulse: 64 74 81 60  Temp: 98.2 F (36.8 C) 98.5 F (36.9 C)  98.7 F (37.1 C)  TempSrc: Oral Oral  Oral  Resp: 18 18  18   Height:      Weight:      SpO2: 97% 97%  95%   Body mass index is 33 kg/(m^2).  Physical  Exam  Constitutional: He is oriented to person, place, and time. No distress.  He is alert, comfortable and in good spirits. He is visiting with his granddaughter.  HENT:  Mouth/Throat: No oropharyngeal exudate.  Eyes: Conjunctivae are normal.  Cardiovascular: Normal rate and regular rhythm.   No murmur heard. Pulmonary/Chest: Breath sounds normal.  Abdominal: Soft. There is no tenderness.  Musculoskeletal: Normal range of motion.  Neurological: He is alert and oriented to person, place, and time.  Skin: No rash noted.  Psychiatric: Mood and affect normal.    Lab Results Lab Results  Component Value Date   WBC 9.8 09/12/2015   HGB 12.8* 09/12/2015   HCT 38.2* 09/12/2015   MCV 89.5 09/12/2015   PLT 139* 09/12/2015    Lab Results  Component Value Date    CREATININE 0.93 09/14/2015   BUN 20 09/14/2015   NA 137 09/14/2015   K 3.8 09/14/2015   CL 105 09/14/2015   CO2 24 09/14/2015    Lab Results  Component Value Date   ALT 26 09/12/2015   AST 21 09/12/2015   ALKPHOS 46 09/12/2015   BILITOT 0.7 09/12/2015     Microbiology: Recent Results (from the past 240 hour(s))  Culture, blood (routine x 2)     Status: None   Collection Time: 09/10/15  9:47 PM  Result Value Ref Range Status   Specimen Description BLOOD BLOOD RIGHT FOREARM  Final   Special Requests BOTTLES DRAWN AEROBIC AND ANAEROBIC 5 ML  Final   Culture  Setup Time   Final    GRAM POSITIVE COCCI IN PAIRS IN CHAINS IN BOTH AEROBIC AND ANAEROBIC BOTTLES CRITICAL RESULT CALLED TO, READ BACK BY AND VERIFIED WITH: A PRIDDY,RN AT 1128 09/11/15 BY L BENFIELD    Culture   Final    STREPTOCOCCUS PNEUMONIAE Performed at [facility]    Report Status 09/13/2015 FINAL  Final   Organism ID, Bacteria STREPTOCOCCUS PNEUMONIAE  Final      Susceptibility   Streptococcus pneumoniae - MIC*    ERYTHROMYCIN <=0.12 SENSITIVE Sensitive     LEVOFLOXACIN 1 SENSITIVE Sensitive     VANCOMYCIN 0.5 SENSITIVE Sensitive     PENICILLIN <=0.06 SENSITIVE Sensitive     CEFTRIAXONE <=0.12 SENSITIVE Sensitive     * STREPTOCOCCUS PNEUMONIAE  Culture, blood (routine x 2)     Status: None   Collection Time: 09/10/15  9:51 PM  Result Value Ref Range Status   Specimen Description BLOOD RIGHT HAND  Final   Special Requests BOTTLES DRAWN AEROBIC AND ANAEROBIC 5 ML  Final   Culture  Setup Time   Final    GRAM POSITIVE COCCI IN PAIRS IN CHAINS IN BOTH AEROBIC AND ANAEROBIC BOTTLES CRITICAL RESULT CALLED TO, READ BACK BY AND VERIFIED WITH: A PRIDDY,RN AT 1128 09/11/15 BY L BENFIELD    Culture   Final    STREPTOCOCCUS PNEUMONIAE SUSCEPTIBILITIES PERFORMED ON PREVIOUS CULTURE WITHIN THE LAST 5 DAYS. Performed at [facility]    Report Status 09/13/2015 FINAL  Final  Urine culture     Status:  None   Collection Time: 09/10/15 10:50 PM  Result Value Ref Range Status   Specimen Description URINE, RANDOM  Final   Special Requests NONE  Final   Culture   Final    NO GROWTH 1 DAY Performed at [facility]    Report Status 09/12/2015 FINAL  Final  Culture, blood (routine x 2)     Status: None (Preliminary result)  Collection Time: 09/12/15  4:57 PM  Result Value Ref Range Status   Specimen Description BLOOD BLOOD LEFT HAND  Final   Special Requests BOTTLES DRAWN AEROBIC AND ANAEROBIC 10CC  Final   Culture   Final    NO GROWTH < 24 HOURS Performed at [facility]    Report Status PENDING  Incomplete  Culture, blood (routine x 2)     Status: None (Preliminary result)   Collection Time: 09/12/15  5:07 PM  Result Value Ref Range Status   Specimen Description BLOOD LEFT ANTECUBITAL  Final   Special Requests BOTTLES DRAWN AEROBIC AND ANAEROBIC 10CC  Final   Culture   Final    NO GROWTH < 24 HOURS Performed at [facility]    Report Status PENDING  Incomplete     ASSESSMENT: He is improving slowly on therapy for pneumococcal bacteremia complicated by left ear infection and lumbar infection. I reviewed treatment options with him. I will switch him to oral levofloxacin and plan on 6 weeks of therapy through 10/23/2015.  PLAN: 1. Change vancomycin to oral levofloxacin 2. Check sedimentation rate and C-reactive protein 3. I will arrange follow-up in my clinic within 4-5 weeks 4. I will sign off now.  Cliffton Asters, MD [facility] [phone] pager   [phone] cell 09/14/2015, 2:48 PM

## 2015-09-14 NOTE — Care Management Important Message (Signed)
Important Message  Patient Details  Name: Alexander Diaz MRN: [id number] Date of Birth: 1929/06/18   Medicare Important Message Given:  Yes    Haskell Flirt 09/14/2015, 1:55 PMImportant Message  Patient Details  Name: Alexander Diaz MRN: [id number] Date of Birth: 09-May-1929   Medicare Important Message Given:  Yes    Haskell Flirt 09/14/2015, 1:55 PM

## 2015-09-14 NOTE — Progress Notes (Signed)
Progress Note   Alexander Diaz ZOX:[id number] DOB: [birth year] DOA: 09/10/2015 PCP: Gaspar Garbe, MD   Brief Narrative:   Alexander Diaz is an 82 y.o. male the PMH of hypertension and hyperlipidemia who was admitted for observation on 09/10/15 with a chief complaint of a 48 hour history of fever, chills and myalgias.  Assessment/Plan:   Principal Problem:   Bacteremia and septic left L3-4 facet joint with early adjacent epidural inflammation - Influenza negative, empiric Tamiflu discontinued 09/11/15. - Blood culture growing strep pneumonia bacteria. Continue vancomycin per ID recommendations. - Hip and lumbar spine films consistent with DDD and osteoarthritis. Follow-up MRI showed septic left L3-L4 joint. - Repeat blood cultures pending. Await ID recommendations regarding further workup/duration of therapy.  Active Problems:   Hypokalemia - Repleted.    Glaucoma - Continue Azopt, Timoptic and Xalatan.    AKI  - Baseline creatinine not known. Creatinine improved with IV fluids.    Left ear pain - Continue Ciprodex.    HTN (hypertension) - Atenolol increased 09/13/15 secondary to elevated blood pressures.  Continue Hydralazine PRN.    DVT Prophylaxis - Lovenox ordered.   Family Communication/Anticipated D/C date and plan/Code Status   Family Communication: Daughter at bedside. Disposition Plan/date: Home when discharge plan outlined by ID.  Code Status: Full code.   IV Access:    Peripheral IV   Procedures and diagnostic studies:   Dg Chest 2 View  09/10/2015  CLINICAL DATA:  Cough and fever. EXAM: CHEST  2 VIEW COMPARISON:  None. FINDINGS: The cardiomediastinal contours are normal. The lungs are clear. Pulmonary vasculature is normal. No consolidation, pleural effusion, or pneumothorax. No acute osseous abnormalities are seen. IMPRESSION: No acute pulmonary process. Electronically Signed   By: Rubye Oaks M.D.   On: 09/10/2015 22:39   Dg Lumbar Spine  Complete  09/10/2015  CLINICAL DATA:  Bilateral lumbosacral back pain, fever. EXAM: LUMBAR SPINE - COMPLETE 4+ VIEW COMPARISON:  None. FINDINGS: Mild broad-based rightward curvature of the spine. No fracture or compression deformity. Disc space narrowing at L4-L5, L5-S1, and L2-L3 with associated endplate spurring. There is facet arthropathy throughout. Atherosclerosis of the abdominal aorta noted. IMPRESSION: Degenerative disc disease and facet arthropathy with mild scoliosis. No acute bony abnormality. Electronically Signed   By: Rubye Oaks M.D.   On: 09/10/2015 22:43   Dg Hip Unilat With Pelvis 2-3 Views Left  09/10/2015  CLINICAL DATA:  Left hip and lumbosacral back pain for 1 day. No known injury. EXAM: DG HIP (WITH OR WITHOUT PELVIS) 2-3V LEFT COMPARISON:  None. FINDINGS: The cortical margins of the bony pelvis and left hip are intact. No fracture. Pubic symphysis and sacroiliac joints are congruent. Both femoral heads are well-seated in the respective acetabula. Minimal acetabular spurring bilaterally. No destructive bony change. Scattered enthesopathic changes are noted. 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  No acute bony abnormality. Electronically Signed   By: Rubye Oaks M.D.   On: 09/10/2015 22:41     Medical Consultants:    ID  Anti-Infectives:   Levaquin 09/11/15--->09/11/15 Vancomycin 09/11/15---> Tamiflu 09/11/15--->09/11/15 Aztreonam  09/11/15--->09/11/15   Subjective:   Alexander Diaz reports some constipation. Still having back pain, 10/10 with movement.  No N/V.  No chest congestion today.  Objective:    Filed Vitals:   09/13/15 0631 09/13/15 1305 09/13/15 2204 09/14/15 0606  BP: 173/64 129/41 156/71 208/85  Pulse: 71 65 64 74  Temp: 98.5 F (36.9 C) 98.7 F (37.1 C) 98.2 F (36.8 C)  98.5 F (36.9 C)  TempSrc: Oral Oral Oral Oral  Resp: 19 18 18 18   Height:      Weight:      SpO2: 97% 95% 97% 97%    Intake/Output Summary (Last 24 hours) at 09/14/15  0835 Last data filed at 09/14/15 4098  Gross per 24 hour  Intake   1150 ml  Output    700 ml  Net    450 ml   Filed Weights   09/10/15 2342  Weight: 104.327 kg (230 lb)    Exam: Gen:  NAD Cardiovascular:  RRR, No M/R/G Respiratory:  Lungs CTAB Gastrointestinal:  Abdomen soft, NT/ND, + BS Extremities:  2-3+ edema, worse on right   Data Reviewed:    Labs: Basic Metabolic Panel:  Recent Labs Lab 09/10/15 2147 09/11/15 0423 09/12/15 0421 09/13/15 0417 09/14/15 0349  NA 134* 137 134* 135 137  K 3.8 3.4* 3.9 3.6 3.8  CL 103 105 105 104 105  CO2 21* 22 22 21* 24  GLUCOSE 132* 100* 128* 123* 124*  BUN 25* 25* 15 14 20   CREATININE 1.36* 1.32* 0.98 0.86 0.93  CALCIUM 9.1 8.5* 8.7* 9.1 9.4   GFR Estimated Creatinine Clearance: 71.5 mL/min (by C-G formula based on Cr of 0.93). Liver Function Tests:  Recent Labs Lab 09/12/15 0421  AST 21  ALT 26  ALKPHOS 46  BILITOT 0.7  PROT 6.4*  ALBUMIN 3.0*   CBC:  Recent Labs Lab 09/10/15 2147 09/11/15 0423 09/12/15 0421  WBC 12.4* 10.4 9.8  NEUTROABS 10.6*  --   --   HGB 13.6 12.6* 12.8*  HCT 39.7 37.6* 38.2*  MCV 89.2 90.2 89.5  PLT 141* 137* 139*   Cardiac Enzymes:  Recent Labs Lab 09/10/15 2157  CKTOTAL 68   Sepsis Labs:  Recent Labs Lab 09/10/15 2147 09/10/15 2200 09/10/15 2320 09/11/15 0423 09/12/15 0421  WBC 12.4*  --   --  10.4 9.8  LATICACIDVEN  --  0.85 0.83  --   --    Microbiology Recent Results (from the past 240 hour(s))  Culture, blood (routine x 2)     Status: None   Collection Time: 09/10/15  9:47 PM  Result Value Ref Range Status   Specimen Description BLOOD BLOOD RIGHT FOREARM  Final   Special Requests BOTTLES DRAWN AEROBIC AND ANAEROBIC 5 ML  Final   Culture  Setup Time   Final    GRAM POSITIVE COCCI IN PAIRS IN CHAINS IN BOTH AEROBIC AND ANAEROBIC BOTTLES CRITICAL RESULT CALLED TO, READ BACK BY AND VERIFIED WITH: A PRIDDY,RN AT 1128 09/11/15 BY L BENFIELD    Culture    Final    STREPTOCOCCUS PNEUMONIAE Performed at [facility]    Report Status 09/13/2015 FINAL  Final   Organism ID, Bacteria STREPTOCOCCUS PNEUMONIAE  Final      Susceptibility   Streptococcus pneumoniae - MIC*    ERYTHROMYCIN <=0.12 SENSITIVE Sensitive     LEVOFLOXACIN 1 SENSITIVE Sensitive     VANCOMYCIN 0.5 SENSITIVE Sensitive     PENICILLIN <=0.06 SENSITIVE Sensitive     CEFTRIAXONE <=0.12 SENSITIVE Sensitive     * STREPTOCOCCUS PNEUMONIAE  Culture, blood (routine x 2)     Status: None   Collection Time: 09/10/15  9:51 PM  Result Value Ref Range Status   Specimen Description BLOOD RIGHT HAND  Final   Special Requests BOTTLES DRAWN AEROBIC AND ANAEROBIC 5 ML  Final   Culture  Setup Time   Final  GRAM POSITIVE COCCI IN PAIRS IN CHAINS IN BOTH AEROBIC AND ANAEROBIC BOTTLES CRITICAL RESULT CALLED TO, READ BACK BY AND VERIFIED WITH: A PRIDDY,RN AT 1128 09/11/15 BY L BENFIELD    Culture   Final    STREPTOCOCCUS PNEUMONIAE SUSCEPTIBILITIES PERFORMED ON PREVIOUS CULTURE WITHIN THE LAST 5 DAYS. Performed at [facility]    Report Status 09/13/2015 FINAL  Final  Urine culture     Status: None   Collection Time: 09/10/15 10:50 PM  Result Value Ref Range Status   Specimen Description URINE, RANDOM  Final   Special Requests NONE  Final   Culture   Final    NO GROWTH 1 DAY Performed at [facility]    Report Status 09/12/2015 FINAL  Final  Culture, blood (routine x 2)     Status: None (Preliminary result)   Collection Time: 09/12/15  4:57 PM  Result Value Ref Range Status   Specimen Description BLOOD BLOOD LEFT HAND  Final   Special Requests BOTTLES DRAWN AEROBIC AND ANAEROBIC 10CC  Final   Culture   Final    NO GROWTH < 24 HOURS Performed at [facility]    Report Status PENDING  Incomplete  Culture, blood (routine x 2)     Status: None (Preliminary result)   Collection Time: 09/12/15  5:07 PM  Result Value Ref Range Status   Specimen  Description BLOOD LEFT ANTECUBITAL  Final   Special Requests BOTTLES DRAWN AEROBIC AND ANAEROBIC 10CC  Final   Culture   Final    NO GROWTH < 24 HOURS Performed at [facility]    Report Status PENDING  Incomplete     Medications:   . aspirin EC  81 mg Oral Daily  . atenolol  50 mg Oral BID  . atorvastatin  40 mg Oral Daily  . brinzolamide  1 drop Both Eyes TID  . enoxaparin (LOVENOX) injection  40 mg Subcutaneous Q24H  . latanoprost  1 drop Both Eyes QHS  . multivitamin with minerals  1 tablet Oral Daily  . terazosin  5 mg Oral BID  . timolol  1 drop Both Eyes QHS  . vancomycin  750 mg Intravenous Q12H   Continuous Infusions: . dextrose 5 % and 0.45 % NaCl with KCl 20 mEq/L 10 mL/hr (09/13/15 0110)    Time spent: 25 minutes.   LOS: 3 days   Alexander Diaz  [facility] Pager [phone]. If unable to reach me by pager, please call my cell phone at [phone].  *Please refer to [email or web address], password [email or web address] to get updated schedule on who will round on this patient, as hospitalists switch teams weekly. If 7PM-7AM, please contact night-coverage at [email or web address], password [email or web address] for any overnight needs.  09/14/2015, 8:35 AM

## 2015-09-15 DIAGNOSIS — M869 Osteomyelitis, unspecified: Secondary | ICD-10-CM

## 2015-09-15 DIAGNOSIS — R7881 Bacteremia: Secondary | ICD-10-CM

## 2015-09-15 DIAGNOSIS — I10 Essential (primary) hypertension: Secondary | ICD-10-CM

## 2015-09-15 DIAGNOSIS — E876 Hypokalemia: Secondary | ICD-10-CM

## 2015-09-15 LAB — C-REACTIVE PROTEIN: CRP: 18.5 mg/dL — ABNORMAL HIGH (ref <1.0)

## 2015-09-15 LAB — SEDIMENTATION RATE: Sed Rate: 67 mm/hr — ABNORMAL HIGH (ref 0–16)

## 2015-09-15 MED ORDER — AMLODIPINE BESYLATE 10 MG PO TABS
10.0000 mg | ORAL_TABLET | Freq: Every day | ORAL | Status: DC
  Administered 2015-09-15 – 2015-09-17 (×3): 10 mg via ORAL
  Filled 2015-09-15 (×3): qty 1

## 2015-09-15 MED ORDER — ONDANSETRON HCL 4 MG PO TABS: 4.0000 mg | ORAL_TABLET | Freq: Four times a day (QID) | ORAL | Status: AC | PRN

## 2015-09-15 MED ORDER — ACETAMINOPHEN 325 MG PO TABS: 650.0000 mg | ORAL_TABLET | Freq: Four times a day (QID) | ORAL | Status: AC | PRN

## 2015-09-15 MED ORDER — LEVOFLOXACIN 750 MG PO TABS: 750.0000 mg | ORAL_TABLET | Freq: Every day | ORAL | Status: DC

## 2015-09-15 MED ORDER — SENNOSIDES-DOCUSATE SODIUM 8.6-50 MG PO TABS: 1.0000 | ORAL_TABLET | Freq: Every evening | ORAL | Status: AC | PRN

## 2015-09-15 NOTE — Progress Notes (Addendum)
Patient ID: Previn Jian, male   DOB: 02/26/1929, 82 y.o.   MRN: [id number] [facility] PROGRESS NOTE  Samvel Zinn WUJ:[id number] DOB: [birth year] DOA: 09/10/2015 PCP: Gaspar Garbe, MD  Brief narrative:    82 y.o. male the PMH of hypertension and hyperlipidemia who presented to hospital 09/10/2015 with fevers, chills and myalgias. He was found to have streptococcus bacteremia and septic left lumbar facet joint. Infectious disease has been seeing him in consultation  Assessment/Plan:     Principal Problem:  Streptococcus bacteremia / septic left L3-4 facet joint with early adjacent epidural inflammation - Hip and lumbar spine films consistent with DDD and osteoarthritis. MRI showed septic left L3-L4 joint. - Blood cultures growing Streptococcus pneumonia but follow-up cultures negative to date - Per infectious disease, stopped vancomycin and use Levaquin through 10/23/2015 - Patient was on Tamiflu but this was stopped 09/11/2015 since influenza was negative  Active Problems:  Hypokalemia - Likely due to sepsis. Supplemented and within normal limits   AKI  - Due to sepsis  - Normalize with hydration   Essential hypertension - Patient is on atenolol. Blood pressure is 202/65 - Will add Norvasc 10 mg daily to see if this helps with blood pressure control  DVT Prophylaxis - Lovenox subcutaneous  Code Status: Full.  Family Communication:  plan of care discussed with the patient and his daughter Juliette Alcide (spoke with her over the phone 3/1) Disposition Plan: home with [facility] in next 1-2 days. By 3/3.  IV access:  Peripheral IV  Procedures and diagnostic studies:    Dg Chest 2 View 09/10/2015 No acute pulmonary process. Electronically Signed By: Rubye Oaks M.D. On: 09/10/2015 22:39   Dg Lumbar Spine Complete 2/24/2017Degenerative disc disease and facet arthropathy with mild scoliosis. No acute bony abnormality. Electronically Signed By: Rubye Oaks M.D.  On: 09/10/2015 22:43   Dg Hip Unilat With Pelvis 2-3 Views Left 09/10/2015 Age related osteoarthritis. No acute bony abnormality. Electronically Signed By: Rubye Oaks M.D. On: 09/10/2015 22:41   Medical Consultants:  ID  Other Consultants:  PT  IAnti-Infectives:   Levaquin 09/11/15--->09/11/15 Vancomycin 09/11/15---> 09/14/2015 Tamiflu 09/11/15--->09/11/15 Aztreonam 09/11/15--->09/11/15 Levaquin 09/14/2015 --> 10/23/2015   Manson Passey, MD  [facility] Pager [phone]  Time spent in minutes: 25 minutes  If 7PM-7AM, please contact night-coverage [email or web address] Password [email or web address] 09/15/2015, 10:43 AM   LOS: 4 days    HPI/Subjective: No acute overnight events. Patient reports feeling weak.   Objective: Filed Vitals:   09/14/15 2249 09/14/15 2358 09/15/15 0502 09/15/15 1035  BP: 205/74 148/57 171/58 202/65  Pulse: 65 58 58   Temp: 98 F (36.7 C)  98.8 F (37.1 C)   TempSrc: Oral  Oral   Resp: 16  16   Height:      Weight:      SpO2: 98%  94%     Intake/Output Summary (Last 24 hours) at 09/15/15 1043 Last data filed at 09/15/15 0941  Gross per 24 hour  Intake    680 ml  Output    875 ml  Net   -195 ml    Exam:   General:  Pt is alert, follows commands appropriately, not in acute distress  Cardiovascular: Regular rate and rhythm, S1/S2 appreciated   Respiratory: Clear to auscultation bilaterally, no wheezing, no crackles, no rhonchi  Abdomen: Soft, non tender, non distended, bowel sounds present  Extremities: No edema, pulses palpable bilaterally  Neuro: Grossly nonfocal  Data Reviewed: Basic Metabolic Panel:  Recent Labs Lab 09/10/15 2147 09/11/15  0423 09/12/15 0421 09/13/15 0417 09/14/15 0349  NA 134* 137 134* 135 137  K 3.8 3.4* 3.9 3.6 3.8  CL 103 105 105 104 105  CO2 21* 22 22 21* 24  GLUCOSE 132* 100* 128* 123* 124*  BUN 25* 25* CREATININE 1.36* 1.32* 0.98 0.86 0.93  CALCIUM 9.1 8.5* 8.7* 9.1 9.4   Liver Function  Tests:  Recent Labs Lab 09/12/15 0421  AST 21  ALT 26  ALKPHOS 46  BILITOT 0.7  PROT 6.4*  ALBUMIN 3.0*   No results for input(s): LIPASE, AMYLASE in the last 168 hours. No results for input(s): AMMONIA in the last 168 hours. CBC:  Recent Labs Lab 09/10/15 2147 09/11/15 0423 09/12/15 0421  WBC 12.4* 10.4 9.8  NEUTROABS 10.6*  --   --   HGB 13.6 12.6* 12.8*  HCT 39.7 37.6* 38.2*  MCV 89.2 90.2 89.5  PLT 141* 137* 139*   Cardiac Enzymes:  Recent Labs Lab 09/10/15 2157  CKTOTAL 68   BNP: Invalid input(s): POCBNP CBG: No results for input(s): GLUCAP in the last 168 hours.  Culture, blood (routine x 2)     Status: None   Collection Time: 09/10/15  9:47 PM  Result Value Ref Range Status   Specimen Description BLOOD BLOOD RIGHT FOREARM  Final   Culture  Setup Time   Final   Culture   Final    STREPTOCOCCUS PNEUMONIAE Performed at [facility]    Report Status 09/13/2015 FINAL  Final   Organism ID, Bacteria STREPTOCOCCUS PNEUMONIAE  Final      Susceptibility   Streptococcus pneumoniae - MIC*    ERYTHROMYCIN <=0.12 SENSITIVE Sensitive     LEVOFLOXACIN 1 SENSITIVE Sensitive     VANCOMYCIN 0.5 SENSITIVE Sensitive     PENICILLIN <=0.06 SENSITIVE Sensitive     CEFTRIAXONE <=0.12 SENSITIVE Sensitive     * STREPTOCOCCUS PNEUMONIAE  Culture, blood (routine x 2)     Status: None   Collection Time: 09/10/15  9:51 PM  Result Value Ref Range Status   Specimen Description BLOOD RIGHT HAND  Final   Culture  Setup Time   Final   Culture   Final    STREPTOCOCCUS PNEUMONIAE    Report Status 09/13/2015 FINAL  Final  Urine culture     Status: None   Collection Time: 09/10/15 10:50 PM  Result Value Ref Range Status   Specimen Description URINE, RANDOM  Final   Special Requests NONE  Final   Culture   Final    NO GROWTH 1 DAY Performed at [facility]    Report Status 09/12/2015 FINAL  Final  Culture, blood (routine x 2)     Status: None (Preliminary  result)   Collection Time: 09/12/15  4:57 PM  Result Value Ref Range Status   Specimen Description BLOOD BLOOD LEFT HAND  Final   Culture   Final    NO GROWTH 2 DAYS Performed at [facility]    Report Status PENDING  Incomplete  Culture, blood (routine x 2)     Status: None (Preliminary result)   Collection Time: 09/12/15  5:07 PM  Result Value Ref Range Status   Specimen Description BLOOD LEFT ANTECUBITAL  Final   Culture   Final    NO GROWTH 2 DAYS Performed at [facility]    Report Status PENDING  Incomplete     Scheduled Meds: . aspirin EC  81 mg Oral Daily  .  atenolol  50 mg Oral BID  . atorvastatin  40 mg Oral Daily  . brinzolamide  1 drop Both Eyes TID  . enoxaparin (LOVENOX) injection  40 mg Subcutaneous Q24H  . HYDROcodone-acetaminophen  1 tablet Oral TID  . latanoprost  1 drop Both Eyes QHS  . levofloxacin  750 mg Oral Q1200  . multivitamin with minerals  1 tablet Oral Daily  . polyethylene glycol  17 g Oral Daily  . terazosin  5 mg Oral BID  . timolol  1 drop Both Eyes QHS   Continuous Infusions: . dextrose 5 % and 0.45 % NaCl with KCl 20 mEq/L 10 mL/hr at 09/14/15 2103

## 2015-09-15 NOTE — Progress Notes (Signed)
[facility]   [facility] is providing the following services: Hospital bed, rolling walker and commode (all to be delivered to the patient's home)  If patient discharges after hours, please call [phone].   Renard Hamper 09/15/2015, 4:43 PM

## 2015-09-15 NOTE — Care Management Note (Addendum)
Case Management Note  Patient Details  Name: Alexander Diaz MRN: [id number] Date of Birth: August 17, 1928  Subjective/Objective:                  Pneumococcal bacteremia Action/Plan: discharge planning Expected Discharge Date:                  Expected Discharge Plan:  [place]  In-House Referral:     Discharge planning Services  CM Consult  Post Acute Care Choice:    Choice offered to:  Patient, Adult Children, Spouse  DME Arranged:    DME Agency:     HH Arranged:    [place] Agency:     Status of Service:  In process, will continue to follow  Medicare Important Message Given:  Yes Date Medicare IM Given:    Medicare IM give by:    Date Additional Medicare IM Given:    Additional Medicare Important Message give by:     If discussed at [place] of Stay Meetings, dates discussed:    Additional Comments: 14:58 CM called [facility] DME rep, Lecretia to please arrange delivery of hospital bed with daughter of pt, Alexander Diaz [phone].  Pt has requested to go home by [facility] tomorrow.  Cm will arrange for PTAAR at discharge.    12:24 Family has notified CM and has decided to go home at discharge with [facility] HHPT/OT/RN/aide/CW.  CM has requested orders and face to face be placed by MD.  Pt requesting 3n1 and rolling walker and hospital bed.  CM called [facility] rep, Manuela Schwartz to update.  CM notes a CIR consult has been made and CM is waiting on results of CIR assessment.  Pt is adamantly opposed to SNF.  CM met with pt and pt's daughter in room to discuss home health services.   CM has also called spouse as she had [facility] in the past and pt states whoever she had will be fine.  Spouse was unsure but thinks it was [facility] and states if he goes home [facility] will be fine. CM has provided a private duty list which family verbalized understanding this is an out-of-pocket expense and will have to be arranged by the family.  Home Health disciplines will be ordered when disposition has been decided upon.  Tentative referral  given to [facility] rep, Manuela Schwartz.   CM will monitor for progress. Dellie Catholic, RN 09/15/2015, 12:24 PM

## 2015-09-15 NOTE — Progress Notes (Signed)
Physical Therapy Treatment Patient Details Name: Donnivan Villena MRN: [id number] DOB: [birth year] Today's Date: 09/15/2015    History of Present Illness Jensyn Cambria is an 82 y.o. male the PMH of hypertension and hyperlipidemia who was admitted for observation on 09/10/15 with a chief complaint of a 48 hour history of fever, chills and myalgias. Suspected septic left L3-4 facet joint     PT Comments    Assisted pt OOB + 2 assist.  Pt instructed on "Log Roll" tech to decrease back pain/stress.  Def need/use of side rails and increased time to transition upper body to upright.  Once EOB, pt required extra support B UE's.  Pt was able to self rise but only from elevated bed and required + 2 assist to power up.  Unable to amb with a walker, used B platform Fara Boros.  Pt was able to amb 3 feet but with difficulty supporting own body weight and B knee buckle without notice.  Recliner following closely behind.  Pt required increased assist to stand from lower level recliner total assist + 2 pt only 10%. Instructed NT to use Hoyer Lift to assist pt back to bed.   Follow Up Recommendations  SNF (Pt refuses SNF and plans to D/C to his home   will need to go home via PTAR.)     Equipment Recommendations       Recommendations for Other Services       Precautions / Restrictions Precautions Precautions: Fall Precaution Comments: B LE knees buckle Restrictions Weight Bearing Restrictions: No    Mobility  Bed Mobility Overal bed mobility: Needs Assistance;+2 for physical assistance;+ 2 for safety/equipment Bed Mobility: Rolling;Sidelying to Sit Rolling: Max assist;+2 for safety/equipment Sidelying to sit: +2 for safety/equipment;Max assist;Total assist       General bed mobility comments: "Log Roll" tech to decrease baxck pain/stress.  Required + 2 assist and 75% VC's on proper tech with def need/use of rails and total assist for trunk.    Transfers Overall transfer level: Needs assistance    Transfers: Sit to/from Stand Sit to Stand: Total assist;+2 physical assistance;+2 safety/equipment         General transfer comment: from elevated bed pt was able to rise to standing with Mod Assist but from lower level recliner required + 2 total assist pt 10%.  Rec nursing use lift to assist pt back to bed.   Ambulation/Gait Ambulation/Gait assistance: +2 safety/equipment;+2 physical assistance;Max assist;Total assist Ambulation Distance (Feet): 6 Feet (3 feet x 2) Assistive device: Bilateral platform walker (EVA walker) Gait Pattern/deviations: Step-to pattern;Decreased step length - right;Decreased step length - left Gait velocity: decreased   General Gait Details: used EVA walker for increased support due to severe back pain which causes pt's B Knees to buckle without notice.  Recliner closely following.    Stairs            Wheelchair Mobility    Modified Rankin (Stroke Patients Only)       Balance                                    Cognition Arousal/Alertness: Awake/alert Behavior During Therapy: WFL for tasks assessed/performed Overall Cognitive Status: Within Functional Limits for tasks assessed                      Exercises      General Comments  Pertinent Vitals/Pain Pain Assessment: 0-10 Pain Score: 9  Pain Location: Back Pain Descriptors / Indicators: Stabbing Pain Intervention(s): Monitored during session;Repositioned    Home Living                      Prior Function            PT Goals (current goals can now be found in the care plan section) Progress towards PT goals: Progressing toward goals    Frequency  Min 3X/week    PT Plan Current plan remains appropriate    Co-evaluation             End of Session Equipment Utilized During Treatment: Gait belt Activity Tolerance: Treatment limited secondary to medical complications (Comment) Patient left: in chair;with call bell/phone  within reach;with family/visitor present     Time: 6295-2841 PT Time Calculation (min) (ACUTE ONLY): 23 min  Charges:  $Gait Training: 8-22 mins $Therapeutic Activity: 8-22 mins                    G Codes:      Felecia Shelling  PTA [facility]  Acute  Rehab Pager      [phone]

## 2015-09-15 NOTE — Discharge Instructions (Addendum)
Levofloxacin tablets °What is this medicine? °LEVOFLOXACIN (lee voe FLOX a sin) is a quinolone antibiotic. It is used to treat certain kinds of bacterial infections. It will not work for colds, flu, or other viral infections. °This medicine may be used for other purposes; ask your health care provider or pharmacist if you have questions. °What should I tell my health care provider before I take this medicine? °They need to know if you have any of these conditions: °-bone problems °-cerebral disease °-history of low levels of potassium in the blood °-irregular heartbeat °-joint problems °-kidney disease °-myasthenia gravis °-seizures °-tendon problems °-tingling of the fingers or toes, or other nerve disorder °-an unusual or allergic reaction to levofloxacin, other quinolone antibiotics, foods, dyes, or preservatives °-pregnant or trying to get pregnant °-breast-feeding °How should I use this medicine? °Take this medicine by mouth with a full glass of water. Follow the directions on the prescription label. This medicine can be taken with or without food. Take your medicine at regular intervals. Do not take your medicine more often than directed. Do not skip doses or stop your medicine early even if you feel better. Do not stop taking except on your doctor's advice. °A special MedGuide will be given to you by the pharmacist with each prescription and refill. Be sure to read this information carefully each time. °Talk to your pediatrician regarding the use of this medicine in children. While this drug may be prescribed for children as young as 6 months for selected conditions, precautions do apply. °Overdosage: If you think you have taken too much of this medicine contact a poison control center or emergency room at once. °NOTE: This medicine is only for you. Do not share this medicine with others. °What if I miss a dose? °If you miss a dose, take it as soon as you remember. If it is almost time for your next dose,  take only that dose. Do not take double or extra doses. °What may interact with this medicine? °Do not take this medicine with any of the following medications: °-arsenic trioxide °-chloroquine °-droperidol °-medicines for irregular heart rhythm like amiodarone, disopyramide, dofetilide, flecainide, quinidine, procainamide, sotalol °-some medicines for depression or mental problems like phenothiazines, pimozide, and ziprasidone °This medicine may also interact with the following medications: °-amoxapine °-antacids °-birth control pills °-cisapride °-dairy products °-didanosine (ddI) buffered tablets or powder °-haloperidol °-multivitamins °-NSAIDS, medicines for pain and inflammation, like ibuprofen or naproxen °-retinoid products like tretinoin or isotretinoin °-risperidone °-some other antibiotics like clarithromycin or erythromycin °-sucralfate °-theophylline °-warfarin °This list may not describe all possible interactions. Give your health care provider a list of all the medicines, herbs, non-prescription drugs, or dietary supplements you use. Also tell them if you smoke, drink alcohol, or use illegal drugs. Some items may interact with your medicine. °What should I watch for while using this medicine? °Tell your doctor or health care professional if your symptoms do not improve or if they get worse. Drink several glasses of water a day and cut down on drinks that contain caffeine. You must not get dehydrated while taking this medicine. °You may get drowsy or dizzy. Do not drive, use machinery, or do anything that needs mental alertness until you know how this medicine affects you. Do not sit or stand up quickly, especially if you are an older patient. This reduces the risk of dizzy or fainting spells. °This medicine can make you more sensitive to the sun. Keep out of the sun. If you cannot avoid   being in the sun, wear protective clothing and use a sunscreen. Do not use sun lamps or tanning beds/booths. Contact  your doctor if you get a sunburn. If you are a diabetic monitor your blood glucose carefully. If you get an unusual reading stop taking this medicine and call your doctor right away. Do not treat diarrhea with over-the-counter products. Contact your doctor if you have diarrhea that lasts more than 2 days or if the diarrhea is severe and watery. Avoid antacids, calcium, iron, and zinc products for 2 hours before and 2 hours after taking a dose of this medicine. What side effects may I notice from receiving this medicine? Side effects that you should report to your doctor or health care professional as soon as possible: -allergic reactions like skin rash or hives, swelling of the face, lips, or tongue -anxious -confusion -depressed mood -diarrhea -fast, irregular heartbeat -hallucination, loss of contact with reality -joint, muscle, or tendon pain or swelling -pain, tingling, numbness in the hands or feet -suicidal thoughts or other mood changes -sunburn -unusually weak or tired Side effects that usually do not require medical attention (report to your doctor or health care professional if they continue or are bothersome): -dry mouth -headache -nausea -trouble sleeping This list may not describe all possible side effects. Call your doctor for medical advice about side effects. You may report side effects to FDA at 1-800-FDA-1088. Where should I keep my medicine? Keep out of the reach of children. Store at room temperature between 15 and 30 degrees C (59 and 86 degrees F). Keep in a tightly closed container. Throw away any unused medicine after the expiration date. NOTE: This sheet is a summary. It may not cover all possible information. If you have questions about this medicine, talk to your doctor, pharmacist, or health care provider.    2016, Elsevier/Gold Standard. (2015-02-11 12:40:18) Atenolol tablets What is this medicine? ATENOLOL (a TEN oh lole) is a beta-blocker. Beta-blockers  reduce the workload on the heart and help it to beat more regularly. This medicine is used to treat high blood pressure and to prevent chest pain. It is also used to protect the heart during a heart attack and to prevent an additional heart attack from occurring. This medicine may be used for other purposes; ask your health care provider or pharmacist if you have questions. What should I tell my health care provider before I take this medicine? They need to know if you have any of these conditions: -diabetes -heart or vessel disease like slow heart rate, worsening heart failure, heart block, sick sinus syndrome or Raynaud's disease -kidney disease -lung or breathing disease, like asthma or emphysema -pheochromocytoma -thyroid disease -an unusual or allergic reaction to atenolol, other beta-blockers, medicines, foods, dyes, or preservatives -pregnant or trying to get pregnant -breast-feeding How should I use this medicine? Take this medicine by mouth with a drink of water. Follow the directions on the prescription label. This medicine may be taken with or without food. Take your medicine at regular intervals. Do not take more medicine than directed. Do not stop taking this medicine suddenly. This could lead to serious heart-related effects. Talk to your pediatrician regarding the use of this medicine in children. Special care may be needed. Overdosage: If you think you have taken too much of this medicine contact a poison control center or emergency room at once. NOTE: This medicine is only for you. Do not share this medicine with others. What if I miss a dose? If  you miss a dose, take it as soon as you can. If it is almost time for your next dose, take only that dose. Do not take double or extra doses. What may interact with this medicine? This medicine may interact with the following medications: -certain medicines for blood pressure, heart disease, irregular heart  beat -clonidine -digoxin -diuretics -dobutamine -epinephrine -isoproterenol -NSAIDs, medicines for pain and inflammation, like ibuprofen or naproxen -reserpine This list may not describe all possible interactions. Give your health care provider a list of all the medicines, herbs, non-prescription drugs, or dietary supplements you use. Also tell them if you smoke, drink alcohol, or use illegal drugs. Some items may interact with your medicine. What should I watch for while using this medicine? Visit your doctor or health care professional for regular check ups. Check your blood pressure and pulse rate regularly. Ask your health care professional what your blood pressure and pulse rate should be, and when you should contact him or her. You may get drowsy or dizzy. Do not drive, use machinery, or do anything that needs mental alertness until you know how this medicine affects you. Do not stand or sit up quickly. Alcohol may interfere with the effect of this medicine. Avoid alcoholic drinks. This medicine can affect blood sugar levels. If you have diabetes, check with your doctor or health care professional before you change your diet or the dose of your diabetic medicine. Do not treat yourself for coughs, colds, or pain while you are taking this medicine without asking your doctor or health care professional for advice. Some ingredients may increase your blood pressure. What side effects may I notice from receiving this medicine? Side effects that you should report to your doctor or health care professional as soon as possible: -allergic reactions like skin rash, itching or hives, swelling of the face, lips, or tongue -breathing problems -changes in vision -chest pain -cold, tingling, or numb hands or feet -depression -fast, irregular heartbeat -feeling faint or lightheaded, falls -fever with sore throat -rapid weight gain -swollen ankles, legs Side effects that usually do not require  medical attention (report to your doctor or health care professional if they continue or are bothersome): -anxiety, nervous -diarrhea -dry skin -change in sex drive or performance -headache -nightmares or trouble sleeping -short term memory loss -stomach upset -unusually tired This list may not describe all possible side effects. Call your doctor for medical advice about side effects. You may report side effects to FDA at 1-800-FDA-1088. Where should I keep my medicine? Keep out of the reach of children. Store at room temperature between 20 and 25 degrees C (68 and 77 degrees F). Close tightly and protect from light. Throw away any unused medicine after the expiration date. NOTE: This sheet is a summary. It may not cover all possible information. If you have questions about this medicine, talk to your doctor, pharmacist, or health care provider.    2016, Elsevier/Gold Standard. (2013-03-09 14:21:28) Amlodipine tablets What is this medicine? AMLODIPINE (am LOE di peen) is a calcium-channel blocker. It affects the amount of calcium found in your heart and muscle cells. This relaxes your blood vessels, which can reduce the amount of work the heart has to do. This medicine is used to lower high blood pressure. It is also used to prevent chest pain. This medicine may be used for other purposes; ask your health care provider or pharmacist if you have questions. What should I tell my health care provider before I take this  medicine? They need to know if you have any of these conditions: -heart problems like heart failure or aortic stenosis -liver disease -an unusual or allergic reaction to amlodipine, other medicines, foods, dyes, or preservatives -pregnant or trying to get pregnant -breast-feeding How should I use this medicine? Take this medicine by mouth with a glass of water. Follow the directions on the prescription label. Take your medicine at regular intervals. Do not take more medicine  than directed. Talk to your pediatrician regarding the use of this medicine in children. Special care may be needed. This medicine has been used in children as young as 6. Persons over 13 years old may have a stronger reaction to this medicine and need smaller doses. Overdosage: If you think you have taken too much of this medicine contact a poison control center or emergency room at once. NOTE: This medicine is only for you. Do not share this medicine with others. What if I miss a dose? If you miss a dose, take it as soon as you can. If it is almost time for your next dose, take only that dose. Do not take double or extra doses. What may interact with this medicine? -herbal or dietary supplements -local or general anesthetics -medicines for high blood pressure -medicines for prostate problems -rifampin This list may not describe all possible interactions. Give your health care provider a list of all the medicines, herbs, non-prescription drugs, or dietary supplements you use. Also tell them if you smoke, drink alcohol, or use illegal drugs. Some items may interact with your medicine. What should I watch for while using this medicine? Visit your doctor or health care professional for regular check ups. Check your blood pressure and pulse rate regularly. Ask your health care professional what your blood pressure and pulse rate should be, and when you should contact him or her. This medicine may make you feel confused, dizzy or lightheaded. Do not drive, use machinery, or do anything that needs mental alertness until you know how this medicine affects you. To reduce the risk of dizzy or fainting spells, do not sit or stand up quickly, especially if you are an older patient. Avoid alcoholic drinks; they can make you more dizzy. Do not suddenly stop taking amlodipine. Ask your doctor or health care professional how you can gradually reduce the dose. What side effects may I notice from receiving this  medicine? Side effects that you should report to your doctor or health care professional as soon as possible: -allergic reactions like skin rash, itching or hives, swelling of the face, lips, or tongue -breathing problems -changes in vision or hearing -chest pain -fast, irregular heartbeat -swelling of legs or ankles Side effects that usually do not require medical attention (report to your doctor or health care professional if they continue or are bothersome): -dry mouth -facial flushing -nausea, vomiting -stomach gas, pain -tired, weak -trouble sleeping This list may not describe all possible side effects. Call your doctor for medical advice about side effects. You may report side effects to FDA at 1-800-FDA-1088. Where should I keep my medicine? Keep out of the reach of children. Store at room temperature between 59 and 86 degrees F (15 and 30 degrees C). Protect from light. Keep container tightly closed. Throw away any unused medicine after the expiration date. NOTE: This sheet is a summary. It may not cover all possible information. If you have questions about this medicine, talk to your doctor, pharmacist, or health care provider.    2016,  Elsevier/Gold Standard. (2012-05-31 11:40:58)

## 2015-09-16 ENCOUNTER — Encounter (HOSPITAL_COMMUNITY): Payer: Self-pay | Admitting: Internal Medicine

## 2015-09-16 DIAGNOSIS — N179 Acute kidney failure, unspecified: Secondary | ICD-10-CM | POA: Diagnosis present

## 2015-09-16 NOTE — Progress Notes (Signed)
Physical Therapy Treatment Patient Details Name: Alexander Diaz MRN: [id number] DOB: 1929/04/08 Today's Date: 09/16/2015    History of Present Illness Alexander Diaz is an 82 y.o. male the PMH of hypertension and hyperlipidemia who was admitted for observation on 09/10/15 with a chief complaint of a 48 hour history of fever, chills and myalgias. Suspected septic left L3-4 facet joint     PT Comments    Patient assisted with + 2 total to stand at [facility], knees buckled with no control of descent. Daughter present. DAUGHTER NOW REQUESTING A HOYER LIFT, WHEEL CHAIR WITH REMOVABLE ARMS AND SWING AWAY LEG RESTS. PATIENT IS  HIGH FALL RISK DUE TO LEGS BUCKLE WITHOUT FORWARNING.  Follow Up Recommendations  SNF (family plans [facility])     Equipment Recommendations  None recommended by PT    Recommendations for Other Services       Precautions / Restrictions Precautions Precautions: Fall Precaution Comments: B LE knees buckle VERY QUICKLY    Mobility  Bed Mobility               General bed mobility comments: UOP BY NURSING  Transfers Overall transfer level: Needs assistance   Transfers: Sit to/from Stand Sit to Stand: Total assist;+2 physical assistance;+2 safety/equipment         General transfer comment: FROM RECLINER STOOD WITH  2 PERSON TOTAL. STOOD  FOR ABOUT 30 SECONS AND MOVED KNEES A LITTLE AND THEN BOTH KNEES BUCKLED AND PATIENT QUICKLY SAT DOWN IN RECLINER. UNABLE TO TRY AGAIN DUE TO PAUIN.  Patient's daughter present and witnessed the buckling.  Ambulation/Gait                 Stairs            Wheelchair Mobility    Modified Rankin (Stroke Patients Only)       Balance Overall balance assessment: Needs assistance         Standing balance support: During functional activity;Bilateral upper extremity supported Standing balance-Leahy Scale: Poor                      Cognition Arousal/Alertness: Awake/alert                           Exercises General Exercises - Lower Extremity Long Arc Quad: AROM;Both;10 reps Hip Flexion/Marching: AROM;Both;10 reps    General Comments        Pertinent Vitals/Pain Pain Score: 6  Pain Location: BACK Pain Descriptors / Indicators: Throbbing;Stabbing Pain Intervention(s): Limited activity within patient's tolerance;Monitored during session;Premedicated before session    Home Living                      Prior Function            PT Goals (current goals can now be found in the care plan section) Progress towards PT goals: Progressing toward goals    Frequency  Min 3X/week    PT Plan Current plan remains appropriate    Co-evaluation             End of Session Equipment Utilized During Treatment: Gait belt Activity Tolerance: Treatment limited secondary to medical complications (Comment);Patient limited by pain Patient left: in chair;with call bell/phone within reach     Time: 1652-1720 PT Time Calculation (min) (ACUTE ONLY): 28 min  Charges:  $Therapeutic Exercise: 8-22 mins $Therapeutic Activity: 8-22 mins  G Codes:      [name] 09/16/2015, 5:58 PM

## 2015-09-16 NOTE — Progress Notes (Signed)
Patient ID: Alexander Diaz, male   DOB: [birth year], 82 y.o.   MRN: [id number] [facility] PROGRESS NOTE  Alexander Diaz GEX:[id number] DOB: 1929-06-22 DOA: 09/10/2015 PCP: Alexander Garbe, MD  Brief narrative:    82 y.o. male the PMH of hypertension and hyperlipidemia who presented to hospital 09/10/2015 with fevers, chills and myalgias. He was found to have streptococcus bacteremia and septic left lumbar facet joint. Infectious disease has been seeing him in consultation  Assessment/Plan:     Principal Problem:  Streptococcus bacteremia / septic left L3-4 facet joint with early adjacent epidural inflammation - Hip and lumbar spine films consistent with DDD and osteoarthritis. MRI showed septic left L3-L4 joint. - Blood cultures grew Streptococcus pneumonia  - Follow-up cultures negative to date - Per infectious disease, use Levaquin through 10/23/2015 - Patient was on Tamiflu but this was stopped 09/11/2015 since influenza was negative  Active Problems:  Hypokalemia - Likely due to sepsis. Supplemented. - Potassium WNL   AKI  - Secondary to sepsis.  - Creatinine stabilized with IV fluids     Dyslipidemia - Continue statin therapy-   Essential hypertension - Blood pressure better with addition of Norvasc to atenolol.  DVT Prophylaxis - Lovenox subQ in hospital   Code Status: Full.  Family Communication:  plan of care discussed with the patient and his daughter Alexander Diaz (spoke with her over the phone 3/1) Disposition Plan: home with [facility] 3/3  IV access:  Peripheral IV  Procedures and diagnostic studies:    Dg Chest 2 View 09/10/2015 No acute pulmonary process. Electronically Signed By: Alexander Diaz M.D. On: 09/10/2015 22:39   Dg Lumbar Spine Complete 2/24/2017Degenerative disc disease and facet arthropathy with mild scoliosis. No acute bony abnormality. Electronically Signed By: Alexander Diaz M.D. On: 09/10/2015 22:43   Dg Hip Unilat With Pelvis 2-3  Views Left 09/10/2015 Age related osteoarthritis. No acute bony abnormality. Electronically Signed By: Alexander Diaz M.D. On: 09/10/2015 22:41   Medical Consultants:  ID  Other Consultants:  PT  IAnti-Infectives:   Levaquin 09/11/15--->09/11/15 Vancomycin 09/11/15---> 09/14/2015 Tamiflu 09/11/15--->09/11/15 Aztreonam 09/11/15--->09/11/15 Levaquin 09/14/2015 --> 10/23/2015   Alexander Passey, MD  [facility] Pager [phone]  Time spent in minutes: 15 minutes  If 7PM-7AM, please contact night-coverage [email or web address] Password [email or web address] 09/16/2015, 12:00 PM   LOS: 5 days    HPI/Subjective: No acute overnight events. Patient reports he feels little better.   Objective: Filed Vitals:   09/15/15 1357 09/15/15 2123 09/16/15 0503 09/16/15 0933  BP: 116/103 151/69 164/63 166/57  Pulse: 55 60 58 57  Temp: 98.2 F (36.8 C) 98.8 F (37.1 C) 97.6 F (36.4 C) 97.9 F (36.6 C)  TempSrc: Oral Oral Oral Oral  Resp: 18 18 188 16  Height:      Weight:      SpO2: 96% 96% 97% 96%    Intake/Output Summary (Last 24 hours) at 09/16/15 1200 Last data filed at 09/16/15 0504  Gross per 24 hour  Intake    240 ml  Output   1350 ml  Net  -1110 ml    Exam:   General:  Pt is alert, not in acute distress  Cardiovascular: RRR, S1/S2 (+)  Respiratory: no wheezing, no crackles, no rhonchi  Abdomen: (+) SB, non tender   Extremities: No swelling, palpable pulses   Neuro: Nonfocal  Data Reviewed: Basic Metabolic Panel:  Recent Labs Lab 09/10/15 2147 09/11/15 0423 09/12/15 0421 09/13/15 0417 09/14/15 0349  NA 134* 137 134* 135 137  K 3.8 3.4*  3.9 3.6 3.8  CL 103 105 105 104 105  CO2 21* 22 22 21* 24  GLUCOSE 132* 100* 128* 123* 124*  BUN 25* 25* CREATININE 1.36* 1.32* 0.98 0.86 0.93  CALCIUM 9.1 8.5* 8.7* 9.1 9.4   Liver Function Tests:  Recent Labs Lab 09/12/15 0421  AST 21  ALT 26  ALKPHOS 46  BILITOT 0.7  PROT 6.4*  ALBUMIN 3.0*   No results for  input(s): LIPASE, AMYLASE in the last 168 hours. No results for input(s): AMMONIA in the last 168 hours. CBC:  Recent Labs Lab 09/10/15 2147 09/11/15 0423 09/12/15 0421  WBC 12.4* 10.4 9.8  NEUTROABS 10.6*  --   --   HGB 13.6 12.6* 12.8*  HCT 39.7 37.6* 38.2*  MCV 89.2 90.2 89.5  PLT 141* 137* 139*   Cardiac Enzymes:  Recent Labs Lab 09/10/15 2157  CKTOTAL 68   BNP: Invalid input(s): POCBNP CBG: No results for input(s): GLUCAP in the last 168 hours.  Culture, blood (routine x 2)     Status: None   Collection Time: 09/10/15  9:47 PM  Result Value Ref Range Status   Specimen Description BLOOD BLOOD RIGHT FOREARM  Final   Culture  Setup Time   Final   Culture   Final    STREPTOCOCCUS PNEUMONIAE Performed at [facility]    Report Status 09/13/2015 FINAL  Final   Organism ID, Bacteria STREPTOCOCCUS PNEUMONIAE  Final      Susceptibility   Streptococcus pneumoniae - MIC*    ERYTHROMYCIN <=0.12 SENSITIVE Sensitive     LEVOFLOXACIN 1 SENSITIVE Sensitive     VANCOMYCIN 0.5 SENSITIVE Sensitive     PENICILLIN <=0.06 SENSITIVE Sensitive     CEFTRIAXONE <=0.12 SENSITIVE Sensitive     * STREPTOCOCCUS PNEUMONIAE  Culture, blood (routine x 2)     Status: None   Collection Time: 09/10/15  9:51 PM  Result Value Ref Range Status   Specimen Description BLOOD RIGHT HAND  Final   Culture  Setup Time   Final   Culture   Final    STREPTOCOCCUS PNEUMONIAE    Report Status 09/13/2015 FINAL  Final  Urine culture     Status: None   Collection Time: 09/10/15 10:50 PM  Result Value Ref Range Status   Specimen Description URINE, RANDOM  Final   Special Requests NONE  Final   Culture   Final    NO GROWTH 1 DAY Performed at [facility]    Report Status 09/12/2015 FINAL  Final  Culture, blood (routine x 2)     Status: None (Preliminary result)   Collection Time: 09/12/15  4:57 PM  Result Value Ref Range Status   Specimen Description BLOOD BLOOD LEFT HAND  Final    Culture   Final    NO GROWTH 2 DAYS Performed at [facility]    Report Status PENDING  Incomplete  Culture, blood (routine x 2)     Status: None (Preliminary result)   Collection Time: 09/12/15  5:07 PM  Result Value Ref Range Status   Specimen Description BLOOD LEFT ANTECUBITAL  Final   Culture   Final    NO GROWTH 2 DAYS Performed at [facility]    Report Status PENDING  Incomplete     Scheduled Meds: . amLODipine  10 mg Oral Daily  . aspirin EC  81 mg Oral Daily  . atenolol  50 mg Oral BID  . atorvastatin  40 mg Oral Daily  . brinzolamide  1 drop Both Eyes TID  . enoxaparin (LOVENOX) injection  40 mg Subcutaneous Q24H  . HYDROcodone-acetaminophen  1 tablet Oral TID  . latanoprost  1 drop Both Eyes QHS  . levofloxacin  750 mg Oral Q1200  . multivitamin with minerals  1 tablet Oral Daily  . polyethylene glycol  17 g Oral Daily  . terazosin  5 mg Oral BID  . timolol  1 drop Both Eyes QHS   Continuous Infusions: . dextrose 5 % and 0.45 % NaCl with KCl 20 mEq/L 10 mL/hr at 09/14/15 2103

## 2015-09-16 NOTE — Progress Notes (Addendum)
Inpatient Rehabilitation  Dr. Elisabeth Pigeon placed an order for IP Rehab consult. I visited pt. and  Arturo Morton (daughter) at the bedside. I note PT is recommending SNF level therapies for pt.  Pt. does not believe he can tolerate 3 hours of intensive therapies.  He has elected to DC home with 24 hour care and home health services.  Daughter states she has the ability to hire round the clock care if that is needed.  Daughter has a question she would like to ask of Freddy Jaksch, [facility].  I left a voice mail for Maralyn Sago requesting that she reach out to [place].  I also updated Maralyn Sago that pt. prefers home with home health instead of CIR.  I will sign off.  Please call if questions.  Weldon Picking PT Inpatient Rehab Admissions Coordinator Cell [phone] Office [phone]

## 2015-09-17 LAB — CULTURE, BLOOD (ROUTINE X 2)
CULTURE: NO GROWTH
Culture: NO GROWTH

## 2015-09-17 MED ORDER — AMLODIPINE BESYLATE 10 MG PO TABS: 10.0000 mg | ORAL_TABLET | Freq: Every day | ORAL | Status: AC

## 2015-09-17 MED ORDER — LEVOFLOXACIN 750 MG PO TABS: 750.0000 mg | ORAL_TABLET | Freq: Every day | ORAL | Status: AC

## 2015-09-17 NOTE — Discharge Summary (Signed)
Physician Discharge Summary  Alexander KurtzHugh Waight YNW:[id number] DOB: 04/05/1929 DOA: 09/10/2015  PCP: Gaspar Garbeichard W Tisovec, MD  Admit date: 09/10/2015 Discharge date: 09/17/2015  Recommendations for Outpatient Follow-up:  We started 1 blood pressure medication Norvasc 10 mg daily . Continue Atenolol but 50 mg once a day. Blood pressure goal is 150/90.  Discharge Diagnoses:  Principal Problem:   Pneumococcal bacteremia Active Problems:   Septic joint (HCC), left L3-4 with early adjacent epidural inflammation   Infection of lumbar spine (HCC)   HTN (hypertension)   Hypokalemia   AKI (acute kidney injury) (HCC)    Discharge Condition: stable   Diet recommendation: as tolerated   History of present illness:  82 y.o. male the PMH of hypertension and hyperlipidemia who presented to hospital 09/10/2015 with fevers, chills and myalgias. He was found to have streptococcus bacteremia and septic left lumbar facet joint. Infectious disease has been seeing him in consultation  Hospital Course:  Assessment/Plan:     Principal Problem:  Streptococcus bacteremia / septic left L3-4 facet joint with early adjacent epidural inflammation - Hip and lumbar spine films consistent with DDD and osteoarthritis. MRI showed septic left L3-L4 joint. - Blood cultures grew Streptococcus pneumonia  - Repeat cultures negative to date - Per infectious disease, use Levaquin through 10/23/2015 - Patient was on Tamiflu, stopped 09/11/2015 since influenza was negative  Active Problems:  Hypokalemia - Likely due to sepsis.  - Supplemented. - Potassium WNL   AKI  - Secondary to sepsis.  - Creatinine normalized with IV fluids    Dyslipidemia - Continue Lipitor    Essential hypertension - Continue Norvasc and atenolol   DVT Prophylaxis - Lovenox subQ in hospital   Code Status: Full.  Family Communication: plan of care discussed with the patient and his daughter melinda at the bedside   IV  access:  Peripheral IV  Procedures and diagnostic studies:   Dg Chest 2 View 09/10/2015 No acute pulmonary process. Electronically Signed By: Rubye OaksMelanie Ehinger M.D. On: 09/10/2015 22:39   Dg Lumbar Spine Complete 2/24/2017Degenerative disc disease and facet arthropathy with mild scoliosis. No acute bony abnormality. Electronically Signed By: Rubye OaksMelanie Ehinger M.D. On: 09/10/2015 22:43   Dg Hip Unilat With Pelvis 2-3 Views Left 09/10/2015 Age related osteoarthritis. No acute bony abnormality. Electronically Signed By: Rubye OaksMelanie Ehinger M.D. On: 09/10/2015 22:41   Medical Consultants:  ID  Other Consultants:  PT IAnti-Infectives:   Levaquin 09/11/15--->09/11/15 Vancomycin 09/11/15---> 09/14/2015 Tamiflu 09/11/15--->09/11/15 Aztreonam 09/11/15--->09/11/15 Levaquin 09/14/2015 --> 10/23/2015     Signed:  Manson PasseyEVINE, Cledith Abdou, MD  [facility] 09/17/2015, 9:05 AM  Pager #: [phone]  Time spent in minutes: more than 30 minutes  Discharge Exam: Filed Vitals:   09/16/15 2123 09/17/15 0501  BP: 138/56 175/61  Pulse: 56 55  Temp: 97.8 F (36.6 C) 97.8 F (36.6 C)  Resp: 18 18   Filed Vitals:   09/16/15 0933 09/16/15 1400 09/16/15 2123 09/17/15 0501  BP: 166/57 134/60 138/56 175/61  Pulse: 57 58 56 55  Temp: 97.9 F (36.6 C) 98.5 F (36.9 C) 97.8 F (36.6 C) 97.8 F (36.6 C)  TempSrc: Oral Oral Oral Oral  Resp: 16 18 18 18   Height:      Weight:      SpO2: 96% 97% 97% 97%    General: Pt is alert, follows commands appropriately, not in acute distress Cardiovascular: Regular rate and rhythm, S1/S2 + Respiratory: Clear to auscultation bilaterally, no wheezing, no crackles, no rhonchi Abdominal: Soft, non tender, non distended, bowel  sounds +, no guarding Extremities: no edema, no cyanosis, pulses palpable bilaterally DP and PT Neuro: Grossly nonfocal  Discharge Instructions  Discharge Instructions    Call MD for:  difficulty breathing, headache or  visual disturbances    Complete by:  As directed      Call MD for:  persistant nausea and vomiting    Complete by:  As directed      Call MD for:  severe uncontrolled pain    Complete by:  As directed      Diet - low sodium heart healthy    Complete by:  As directed      Discharge instructions    Complete by:  As directed   We started 1 blood pressure medication Norvasc 10 mg daily . Continue Atenolol but 50 mg once a day. Blood pressure goal is 150/90.     Increase activity slowly    Complete by:  As directed             Medication List    STOP taking these medications        oseltamivir 75 MG capsule  Commonly known as:  TAMIFLU      TAKE these medications        acetaminophen 325 MG tablet  Commonly known as:  TYLENOL  Take 2 tablets (650 mg total) by mouth every 6 (six) hours as needed for mild pain (or Fever >/= 101).     amLODipine 10 MG tablet  Commonly known as:  NORVASC  Take 1 tablet (10 mg total) by mouth daily.     aspirin EC 81 MG tablet  Take 81 mg by mouth daily.     atenolol 50 MG tablet  Commonly known as:  TENORMIN  Take 50 mg by mouth daily.     atorvastatin 40 MG tablet  Commonly known as:  LIPITOR  Take 40 mg by mouth daily.     AZOPT 1 % ophthalmic suspension  Generic drug:  brinzolamide  Place 1 drop into both eyes 3 (three) times daily.     diclofenac 75 MG EC tablet  Commonly known as:  VOLTAREN  Take 75 mg by mouth 2 (two) times daily.     latanoprost 0.005 % ophthalmic solution  Commonly known as:  XALATAN  Place 1 drop into both eyes at bedtime.     levofloxacin 750 MG tablet  Commonly known as:  LEVAQUIN  Take 1 tablet (750 mg total) by mouth daily at 12 noon.     multivitamin with minerals Tabs tablet  Take 1 tablet by mouth daily.     ondansetron 4 MG tablet  Commonly known as:  ZOFRAN  Take 1 tablet (4 mg total) by mouth every 6 (six) hours as needed for nausea.     senna-docusate 8.6-50 MG tablet  Commonly known  as:  Senokot-S  Take 1 tablet by mouth at bedtime as needed for mild constipation.     terazosin 5 MG capsule  Commonly known as:  HYTRIN  Take 5 mg by mouth 2 (two) times daily.     timolol 0.5 % ophthalmic gel-forming  Commonly known as:  TIMOPTIC-XR  Place 1 drop into both eyes at bedtime.           Follow-up Information    Follow up with Gaspar Garbe, MD. Schedule an appointment as soon as possible for a visit in 2 weeks.   Specialty:  Internal Medicine   Why:  Follow up  appt after recent hospitalization   Contact information:   [address] [place] [address] [phone]       Follow up with [facility]   Why:  hospital bed, rolling walker and 3n1   Contact information:   [address] [place] [address] [phone]       Follow up with [facility].   Why:  home health physical and occupational therapy, nurse, aide, social worker   Contact information:   [address] [place] [address] [phone]       Follow up with Gaspar Garbe, MD. Schedule an appointment as soon as possible for a visit in 1 week.   Specialty:  Internal Medicine   Why:  Follow up appt after recent hospitalization   Contact information:   [address] [place] [address] [phone]        The results of significant diagnostics from this hospitalization (including imaging, microbiology, ancillary and laboratory) are listed below for reference.    Significant Diagnostic Studies: Dg Chest 2 View  09/10/2015  CLINICAL DATA:  Cough and fever. EXAM: CHEST  2 VIEW COMPARISON:  None. FINDINGS: The cardiomediastinal contours are normal. The lungs are clear. Pulmonary vasculature is normal. No consolidation, pleural effusion, or pneumothorax. No acute osseous abnormalities are seen. IMPRESSION: No acute pulmonary process. Electronically Signed   By: Rubye Oaks M.D.   On: 09/10/2015 22:39   Dg Lumbar Spine  Complete  09/10/2015  CLINICAL DATA:  Bilateral lumbosacral back pain, fever. EXAM: LUMBAR SPINE - COMPLETE 4+ VIEW COMPARISON:  None. FINDINGS: Mild broad-based rightward curvature of the spine. No fracture or compression deformity. Disc space narrowing at L4-L5, L5-S1, and L2-L3 with associated endplate spurring. There is facet arthropathy throughout. Atherosclerosis of the abdominal aorta noted. IMPRESSION: Degenerative disc disease and facet arthropathy with mild scoliosis. No acute bony abnormality. Electronically Signed   By: Rubye Oaks M.D.   On: 09/10/2015 22:43   Mr Lumbar Spine W Wo Contrast  09/12/2015  CLINICAL DATA:  Low back pain and fever.  Unable to bear weight. EXAM: MRI LUMBAR SPINE WITHOUT AND WITH CONTRAST TECHNIQUE: Multiplanar and multiecho pulse sequences of the lumbar spine were obtained without and with intravenous contrast. CONTRAST:  20mL MULTIHANCE GADOBENATE DIMEGLUMINE 529 MG/ML IV SOLN COMPARISON:  09/10/2015 FINDINGS: The lowest lumbar type non-rib-bearing vertebra is labeled as L5. The conus medullaris appears normal. Conus level: T12-L1. 4 mm degenerative retrolisthesis at L2-3. Prominent epidural adipose tissues and short pedicles at all levels between L2 and L5. Intervertebral disc desiccation is observed at all levels between L1 and L5. There is loss of disc height at the L5-S1 level but with some faintly increased signal in the intervertebral disc on T2 weighted images such as image 8 series 7. I do not see a great deal of enhancement in the intervertebral disc or in the adjacent vertebral endplates although there are type 2 degenerative endplate findings at L5-S1. Schmorl' s node along the inferior endplate of L2. A fluid signal intensity lesion of the right kidney is partially included on today's exam and statistically likely to represent a cyst although technically nonspecific. Bilateral perirenal stranding. There is asymmetric edema in the left posterior paraspinal  muscular tissues. Subtle asymmetric edema in the left psoas muscle. Additional findings at individual levels are as follows: L1-2: No impingement.  Mild disc bulge. L2-3: Moderate central narrowing of the thecal sac with mild bilateral foraminal stenosis, mild  displacement of the right L2 nerve in the lateral extraforaminal space, and mild bilateral subarticular lateral recess stenosis due to disc bulge, short pedicles, prominent epidural adipose tissue, facet arthropathy, and a small right foraminal and lateral extraforaminal disc protrusion. L3-4: Prominent central narrowing of the thecal sac with moderate to prominent left and moderate right foraminal stenosis and moderate displacement of both L3 nerves in the lateral extraforaminal space due to disc bulge, short pedicles, prominent epidural adipose tissue, and left greater than right facet arthropathy. Cross-sectional area of the thecal sac 0.4 cm^2. Small bilateral facet joint effusions. Perifacet edema and enhancement on the left, with a suspected adjacent synovial cyst measuring 1.5 by 0.7 cm posterior to the inferior margin of the facet joint, not in a position to cause impingement. There is left posterolateral enhancement along the epidural space at this level and tracking at the L4 level, image 7 series 8. L4-5: Prominent central narrowing of the thecal sac with moderate bilateral foraminal stenosis, moderate bilateral subarticular lateral recess stenosis, and mild displacement of both L4 nerves in the lateral extraforaminal space due to short pedicles, prominent epidural adipose tissue, disc bulge, and facet arthropathy. Cross-sectional area of the thecal sac 0.3 cm^2. L5-S1: Prominent right and moderate to prominent left foraminal stenosis with mild bilateral subarticular lateral recess stenosis due to intervertebral spurring and facet arthropathy. IMPRESSION: 1. Suspected septic left L3-4 facet joint with early adjacent epidural inflammation/extension  is suspected given the provided history of bacteremia and fever along with the perifacet inflammation/edema and mild left posterolateral epidural enhancement in this area. Sterile facet arthropathy is a possibility but does not thin explain the paraspinal edema and would be a less likely cause for the asymmetric epidural enhancement. No overt abscess in the epidural space is currently seen. 2. Lumbar spondylosis, degenerative disc disease, and short pedicles cause prominent impingement at L3-4, L4-5, and L5-S1; and moderate impingement at L2- 3, as detailed above. These results will be called to the ordering clinician or representative by the Radiologist Assistant, and communication documented in the PACS or zVision Dashboard. Electronically Signed   By: Gaylyn Rong M.D.   On: 09/12/2015 13:27   Dg Hip Unilat With Pelvis 2-3 Views Left  09/10/2015  CLINICAL DATA:  Left hip and lumbosacral back pain for 1 day. No known injury. EXAM: DG HIP (WITH OR WITHOUT PELVIS) 2-3V LEFT COMPARISON:  None. FINDINGS: The cortical margins of the bony pelvis and left hip are intact. No fracture. Pubic symphysis and sacroiliac joints are congruent. Both femoral heads are well-seated in the respective acetabula. Minimal acetabular spurring bilaterally. No destructive bony change. Scattered enthesopathic changes are noted. 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  No acute bony abnormality. Electronically Signed   By: Rubye Oaks M.D.   On: 09/10/2015 22:41    Microbiology: Recent Results (from the past 240 hour(s))  Culture, blood (routine x 2)     Status: None   Collection Time: 09/10/15  9:47 PM  Result Value Ref Range Status   Specimen Description BLOOD BLOOD RIGHT FOREARM  Final   Special Requests BOTTLES DRAWN AEROBIC AND ANAEROBIC 5 ML  Final   Culture  Setup Time   Final    GRAM POSITIVE COCCI IN PAIRS IN CHAINS IN BOTH AEROBIC AND ANAEROBIC BOTTLES CRITICAL RESULT CALLED TO, READ BACK BY AND VERIFIED  WITH: A PRIDDY,RN AT 1128 09/11/15 BY L BENFIELD    Culture   Final    STREPTOCOCCUS PNEUMONIAE Performed at [facility]  Report Status 09/13/2015 FINAL  Final   Organism ID, Bacteria STREPTOCOCCUS PNEUMONIAE  Final      Susceptibility   Streptococcus pneumoniae - MIC*    ERYTHROMYCIN <=0.12 SENSITIVE Sensitive     LEVOFLOXACIN 1 SENSITIVE Sensitive     VANCOMYCIN 0.5 SENSITIVE Sensitive     PENICILLIN <=0.06 SENSITIVE Sensitive     CEFTRIAXONE <=0.12 SENSITIVE Sensitive     * STREPTOCOCCUS PNEUMONIAE  Culture, blood (routine x 2)     Status: None   Collection Time: 09/10/15  9:51 PM  Result Value Ref Range Status   Specimen Description BLOOD RIGHT HAND  Final   Special Requests BOTTLES DRAWN AEROBIC AND ANAEROBIC 5 ML  Final   Culture  Setup Time   Final    GRAM POSITIVE COCCI IN PAIRS IN CHAINS IN BOTH AEROBIC AND ANAEROBIC BOTTLES CRITICAL RESULT CALLED TO, READ BACK BY AND VERIFIED WITH: A PRIDDY,RN AT 1128 09/11/15 BY L BENFIELD    Culture   Final    STREPTOCOCCUS PNEUMONIAE SUSCEPTIBILITIES PERFORMED ON PREVIOUS CULTURE WITHIN THE LAST 5 DAYS. Performed at [facility]    Report Status 09/13/2015 FINAL  Final  Urine culture     Status: None   Collection Time: 09/10/15 10:50 PM  Result Value Ref Range Status   Specimen Description URINE, RANDOM  Final   Special Requests NONE  Final   Culture   Final    NO GROWTH 1 DAY Performed at [facility]    Report Status 09/12/2015 FINAL  Final  Culture, blood (routine x 2)     Status: None (Preliminary result)   Collection Time: 09/12/15  4:57 PM  Result Value Ref Range Status   Specimen Description BLOOD BLOOD LEFT HAND  Final   Special Requests BOTTLES DRAWN AEROBIC AND ANAEROBIC 10CC  Final   Culture   Final    NO GROWTH 4 DAYS Performed at [facility]    Report Status PENDING  Incomplete  Culture, blood (routine x 2)     Status: None (Preliminary result)   Collection Time: 09/12/15   5:07 PM  Result Value Ref Range Status   Specimen Description BLOOD LEFT ANTECUBITAL  Final   Special Requests BOTTLES DRAWN AEROBIC AND ANAEROBIC 10CC  Final   Culture   Final    NO GROWTH 4 DAYS Performed at [facility]    Report Status PENDING  Incomplete     Labs: Basic Metabolic Panel:  Recent Labs Lab 09/10/15 2147 09/11/15 0423 09/12/15 0421 09/13/15 0417 09/14/15 0349  NA 134* 137 134* 135 137  K 3.8 3.4* 3.9 3.6 3.8  CL 103 105 105 104 105  CO2 21* 22 22 21* 24  GLUCOSE 132* 100* 128* 123* 124*  BUN 25* 25* 15 14 20   CREATININE 1.36* 1.32* 0.98 0.86 0.93  CALCIUM 9.1 8.5* 8.7* 9.1 9.4   Liver Function Tests:  Recent Labs Lab 09/12/15 0421  AST 21  ALT 26  ALKPHOS 46  BILITOT 0.7  PROT 6.4*  ALBUMIN 3.0*   No results for input(s): LIPASE, AMYLASE in the last 168 hours. No results for input(s): AMMONIA in the last 168 hours. CBC:  Recent Labs Lab 09/10/15 2147 09/11/15 0423 09/12/15 0421  WBC 12.4* 10.4 9.8  NEUTROABS 10.6*  --   --   HGB 13.6 12.6* 12.8*  HCT 39.7 37.6* 38.2*  MCV 89.2 90.2 89.5  PLT 141* 137* 139*   Cardiac Enzymes:  Recent Labs Lab 09/10/15  2157  CKTOTAL 68   BNP: BNP (last 3 results) No results for input(s): BNP in the last 8760 hours.  ProBNP (last 3 results) No results for input(s): PROBNP in the last 8760 hours.  CBG: No results for input(s): GLUCAP in the last 168 hours.

## 2015-09-17 NOTE — Care Management Note (Signed)
Case Management Note  Patient Details  Name: Faylene KurtzHugh Cayabyab MRN: [id number] Date of Birth: 09/22/1928  Subjective/Objective:      Admitted with bacteremia / septic left L3-4 facet joint               Action/Plan: Discharge planning, spoke with patient and family at beside. [facility]. [facility] bed, RW, 3-n-1, and hoyer lift contacted [facility] to deliver to room. Transport form on chart, nurse to contact [facility] when ready for transport.  Expected Discharge Date:                  Expected Discharge Plan:  Home w Home Health Services  In-House Referral:     Discharge planning Services  CM Consult  Post Acute Care Choice:  Durable Medical Equipment Choice offered to:  Patient, Adult Children, Spouse  DME Arranged:  3-N-1, Walker rolling, Hospital bed, Other see comment (hoyer lift) DME Agency:  [facility]  HH Arranged:  RN, PT, OT, Nurse's Aide, Social Work [employer] [facility]  [facility]  Status of Service:  Completed, signed off  Medicare Important Message Given:  Yes Date Medicare IM Given:    Medicare IM give by:    Date Additional Medicare IM Given:    Additional Medicare Important Message give by:     If discussed at Long Length of Stay Meetings, dates discussed:    Additional Comments:  Alexis Goodelleele, Estrellita Lasky K, RN 09/17/2015, 10:44 AM [phone]

## 2015-09-17 NOTE — Progress Notes (Signed)
[facility]  Rec'd order for hoyer lift to be delivered to home today.  [name] 09/17/2015, 11:52 AM

## 2015-09-18 DIAGNOSIS — Z7982 Long term (current) use of aspirin: Secondary | ICD-10-CM | POA: Diagnosis not present

## 2015-09-18 DIAGNOSIS — M1612 Unilateral primary osteoarthritis, left hip: Secondary | ICD-10-CM | POA: Diagnosis not present

## 2015-09-18 DIAGNOSIS — Z87891 Personal history of nicotine dependence: Secondary | ICD-10-CM | POA: Diagnosis not present

## 2015-09-18 DIAGNOSIS — E785 Hyperlipidemia, unspecified: Secondary | ICD-10-CM | POA: Diagnosis not present

## 2015-09-18 DIAGNOSIS — M4806 Spinal stenosis, lumbar region: Secondary | ICD-10-CM | POA: Diagnosis not present

## 2015-09-18 DIAGNOSIS — A491 Streptococcal infection, unspecified site: Secondary | ICD-10-CM | POA: Diagnosis not present

## 2015-09-18 DIAGNOSIS — M0088 Arthritis due to other bacteria, vertebrae: Secondary | ICD-10-CM | POA: Diagnosis not present

## 2015-09-18 DIAGNOSIS — I10 Essential (primary) hypertension: Secondary | ICD-10-CM | POA: Diagnosis not present

## 2015-09-20 DIAGNOSIS — M1612 Unilateral primary osteoarthritis, left hip: Secondary | ICD-10-CM | POA: Diagnosis not present

## 2015-09-20 DIAGNOSIS — I10 Essential (primary) hypertension: Secondary | ICD-10-CM | POA: Diagnosis not present

## 2015-09-20 DIAGNOSIS — E785 Hyperlipidemia, unspecified: Secondary | ICD-10-CM | POA: Diagnosis not present

## 2015-09-20 DIAGNOSIS — M0088 Arthritis due to other bacteria, vertebrae: Secondary | ICD-10-CM | POA: Diagnosis not present

## 2015-09-20 DIAGNOSIS — M4806 Spinal stenosis, lumbar region: Secondary | ICD-10-CM | POA: Diagnosis not present

## 2015-09-20 DIAGNOSIS — A491 Streptococcal infection, unspecified site: Secondary | ICD-10-CM | POA: Diagnosis not present

## 2015-09-21 DIAGNOSIS — E785 Hyperlipidemia, unspecified: Secondary | ICD-10-CM | POA: Diagnosis not present

## 2015-09-21 DIAGNOSIS — M4806 Spinal stenosis, lumbar region: Secondary | ICD-10-CM | POA: Diagnosis not present

## 2015-09-21 DIAGNOSIS — M0088 Arthritis due to other bacteria, vertebrae: Secondary | ICD-10-CM | POA: Diagnosis not present

## 2015-09-21 DIAGNOSIS — I10 Essential (primary) hypertension: Secondary | ICD-10-CM | POA: Diagnosis not present

## 2015-09-21 DIAGNOSIS — A491 Streptococcal infection, unspecified site: Secondary | ICD-10-CM | POA: Diagnosis not present

## 2015-09-21 DIAGNOSIS — M1612 Unilateral primary osteoarthritis, left hip: Secondary | ICD-10-CM | POA: Diagnosis not present

## 2015-09-22 DIAGNOSIS — M0088 Arthritis due to other bacteria, vertebrae: Secondary | ICD-10-CM | POA: Diagnosis not present

## 2015-09-22 DIAGNOSIS — A491 Streptococcal infection, unspecified site: Secondary | ICD-10-CM | POA: Diagnosis not present

## 2015-09-22 DIAGNOSIS — M1612 Unilateral primary osteoarthritis, left hip: Secondary | ICD-10-CM | POA: Diagnosis not present

## 2015-09-22 DIAGNOSIS — E785 Hyperlipidemia, unspecified: Secondary | ICD-10-CM | POA: Diagnosis not present

## 2015-09-22 DIAGNOSIS — I10 Essential (primary) hypertension: Secondary | ICD-10-CM | POA: Diagnosis not present

## 2015-09-22 DIAGNOSIS — M4806 Spinal stenosis, lumbar region: Secondary | ICD-10-CM | POA: Diagnosis not present

## 2015-09-24 DIAGNOSIS — M4806 Spinal stenosis, lumbar region: Secondary | ICD-10-CM | POA: Diagnosis not present

## 2015-09-24 DIAGNOSIS — M0088 Arthritis due to other bacteria, vertebrae: Secondary | ICD-10-CM | POA: Diagnosis not present

## 2015-09-24 DIAGNOSIS — M1612 Unilateral primary osteoarthritis, left hip: Secondary | ICD-10-CM | POA: Diagnosis not present

## 2015-09-24 DIAGNOSIS — I10 Essential (primary) hypertension: Secondary | ICD-10-CM | POA: Diagnosis not present

## 2015-09-24 DIAGNOSIS — E785 Hyperlipidemia, unspecified: Secondary | ICD-10-CM | POA: Diagnosis not present

## 2015-09-24 DIAGNOSIS — A491 Streptococcal infection, unspecified site: Secondary | ICD-10-CM | POA: Diagnosis not present

## 2015-09-27 DIAGNOSIS — M4806 Spinal stenosis, lumbar region: Secondary | ICD-10-CM | POA: Diagnosis not present

## 2015-09-27 DIAGNOSIS — I10 Essential (primary) hypertension: Secondary | ICD-10-CM | POA: Diagnosis not present

## 2015-09-27 DIAGNOSIS — M0088 Arthritis due to other bacteria, vertebrae: Secondary | ICD-10-CM | POA: Diagnosis not present

## 2015-09-27 DIAGNOSIS — A491 Streptococcal infection, unspecified site: Secondary | ICD-10-CM | POA: Diagnosis not present

## 2015-09-27 DIAGNOSIS — E785 Hyperlipidemia, unspecified: Secondary | ICD-10-CM | POA: Diagnosis not present

## 2015-09-27 DIAGNOSIS — M1612 Unilateral primary osteoarthritis, left hip: Secondary | ICD-10-CM | POA: Diagnosis not present

## 2015-09-28 DIAGNOSIS — A491 Streptococcal infection, unspecified site: Secondary | ICD-10-CM | POA: Diagnosis not present

## 2015-09-28 DIAGNOSIS — M0088 Arthritis due to other bacteria, vertebrae: Secondary | ICD-10-CM | POA: Diagnosis not present

## 2015-09-28 DIAGNOSIS — I10 Essential (primary) hypertension: Secondary | ICD-10-CM | POA: Diagnosis not present

## 2015-09-28 DIAGNOSIS — E785 Hyperlipidemia, unspecified: Secondary | ICD-10-CM | POA: Diagnosis not present

## 2015-09-28 DIAGNOSIS — M1612 Unilateral primary osteoarthritis, left hip: Secondary | ICD-10-CM | POA: Diagnosis not present

## 2015-09-28 DIAGNOSIS — M4806 Spinal stenosis, lumbar region: Secondary | ICD-10-CM | POA: Diagnosis not present

## 2015-09-29 DIAGNOSIS — M0088 Arthritis due to other bacteria, vertebrae: Secondary | ICD-10-CM | POA: Diagnosis not present

## 2015-09-29 DIAGNOSIS — E785 Hyperlipidemia, unspecified: Secondary | ICD-10-CM | POA: Diagnosis not present

## 2015-09-29 DIAGNOSIS — A491 Streptococcal infection, unspecified site: Secondary | ICD-10-CM | POA: Diagnosis not present

## 2015-09-29 DIAGNOSIS — M1612 Unilateral primary osteoarthritis, left hip: Secondary | ICD-10-CM | POA: Diagnosis not present

## 2015-09-29 DIAGNOSIS — M4806 Spinal stenosis, lumbar region: Secondary | ICD-10-CM | POA: Diagnosis not present

## 2015-09-29 DIAGNOSIS — I10 Essential (primary) hypertension: Secondary | ICD-10-CM | POA: Diagnosis not present

## 2015-10-01 DIAGNOSIS — M0088 Arthritis due to other bacteria, vertebrae: Secondary | ICD-10-CM | POA: Diagnosis not present

## 2015-10-01 DIAGNOSIS — E785 Hyperlipidemia, unspecified: Secondary | ICD-10-CM | POA: Diagnosis not present

## 2015-10-01 DIAGNOSIS — A491 Streptococcal infection, unspecified site: Secondary | ICD-10-CM | POA: Diagnosis not present

## 2015-10-01 DIAGNOSIS — M1612 Unilateral primary osteoarthritis, left hip: Secondary | ICD-10-CM | POA: Diagnosis not present

## 2015-10-01 DIAGNOSIS — I10 Essential (primary) hypertension: Secondary | ICD-10-CM | POA: Diagnosis not present

## 2015-10-01 DIAGNOSIS — M4806 Spinal stenosis, lumbar region: Secondary | ICD-10-CM | POA: Diagnosis not present

## 2015-10-04 DIAGNOSIS — R7881 Bacteremia: Secondary | ICD-10-CM | POA: Diagnosis not present

## 2015-10-04 DIAGNOSIS — M4806 Spinal stenosis, lumbar region: Secondary | ICD-10-CM | POA: Diagnosis not present

## 2015-10-04 DIAGNOSIS — A491 Streptococcal infection, unspecified site: Secondary | ICD-10-CM | POA: Diagnosis not present

## 2015-10-04 DIAGNOSIS — I10 Essential (primary) hypertension: Secondary | ICD-10-CM | POA: Diagnosis not present

## 2015-10-04 DIAGNOSIS — M0088 Arthritis due to other bacteria, vertebrae: Secondary | ICD-10-CM | POA: Diagnosis not present

## 2015-10-04 DIAGNOSIS — G061 Intraspinal abscess and granuloma: Secondary | ICD-10-CM | POA: Diagnosis not present

## 2015-10-04 DIAGNOSIS — M1612 Unilateral primary osteoarthritis, left hip: Secondary | ICD-10-CM | POA: Diagnosis not present

## 2015-10-04 DIAGNOSIS — E785 Hyperlipidemia, unspecified: Secondary | ICD-10-CM | POA: Diagnosis not present

## 2015-10-07 DIAGNOSIS — I10 Essential (primary) hypertension: Secondary | ICD-10-CM | POA: Diagnosis not present

## 2015-10-07 DIAGNOSIS — M4806 Spinal stenosis, lumbar region: Secondary | ICD-10-CM | POA: Diagnosis not present

## 2015-10-07 DIAGNOSIS — M0088 Arthritis due to other bacteria, vertebrae: Secondary | ICD-10-CM | POA: Diagnosis not present

## 2015-10-07 DIAGNOSIS — E785 Hyperlipidemia, unspecified: Secondary | ICD-10-CM | POA: Diagnosis not present

## 2015-10-07 DIAGNOSIS — A491 Streptococcal infection, unspecified site: Secondary | ICD-10-CM | POA: Diagnosis not present

## 2015-10-07 DIAGNOSIS — M1612 Unilateral primary osteoarthritis, left hip: Secondary | ICD-10-CM | POA: Diagnosis not present

## 2015-10-15 DIAGNOSIS — M0088 Arthritis due to other bacteria, vertebrae: Secondary | ICD-10-CM | POA: Diagnosis not present

## 2015-10-15 DIAGNOSIS — I10 Essential (primary) hypertension: Secondary | ICD-10-CM | POA: Diagnosis not present

## 2015-10-15 DIAGNOSIS — E785 Hyperlipidemia, unspecified: Secondary | ICD-10-CM | POA: Diagnosis not present

## 2015-10-15 DIAGNOSIS — M1612 Unilateral primary osteoarthritis, left hip: Secondary | ICD-10-CM | POA: Diagnosis not present

## 2015-10-15 DIAGNOSIS — A491 Streptococcal infection, unspecified site: Secondary | ICD-10-CM | POA: Diagnosis not present

## 2015-10-15 DIAGNOSIS — M4806 Spinal stenosis, lumbar region: Secondary | ICD-10-CM | POA: Diagnosis not present

## 2015-10-16 DIAGNOSIS — R7881 Bacteremia: Secondary | ICD-10-CM | POA: Diagnosis not present

## 2015-10-16 DIAGNOSIS — H9190 Unspecified hearing loss, unspecified ear: Secondary | ICD-10-CM | POA: Diagnosis not present

## 2015-10-16 DIAGNOSIS — Z6832 Body mass index (BMI) 32.0-32.9, adult: Secondary | ICD-10-CM | POA: Diagnosis not present

## 2015-10-16 DIAGNOSIS — I10 Essential (primary) hypertension: Secondary | ICD-10-CM | POA: Diagnosis not present

## 2015-10-16 DIAGNOSIS — G061 Intraspinal abscess and granuloma: Secondary | ICD-10-CM | POA: Diagnosis not present

## 2015-10-18 ENCOUNTER — Encounter: Payer: Self-pay | Admitting: Internal Medicine

## 2015-10-18 ENCOUNTER — Ambulatory Visit (INDEPENDENT_AMBULATORY_CARE_PROVIDER_SITE_OTHER): Payer: Medicare Other | Admitting: Internal Medicine

## 2015-10-18 VITALS — BP 144/75 | HR 57 | Temp 97.7°F | Wt 219.0 lb

## 2015-10-18 DIAGNOSIS — M4646 Discitis, unspecified, lumbar region: Secondary | ICD-10-CM | POA: Diagnosis not present

## 2015-10-18 LAB — C-REACTIVE PROTEIN: CRP: <0.5 mg/dL (ref <0.60)

## 2015-10-18 LAB — SEDIMENTATION RATE: SED RATE: 3 mm/h (ref 0–20)

## 2015-10-18 NOTE — Assessment & Plan Note (Signed)
He has had rapid and complete improvement on therapy for pneumococcal bacteremia and lumbar infection complicating left ear otitis media. He will complete 5 more days of levofloxacin. I will recheck his inflammatory markers today. He will follow-up with me in 6 weeks.

## 2015-10-18 NOTE — Progress Notes (Signed)
[facility]  Patient Active Problem List   Diagnosis Date Noted  . Discitis of lumbar region 09/12/2015    Priority: High  . Pneumococcal bacteremia 09/11/2015    Priority: High  . Otitis media     Priority: High  . AKI (acute kidney injury) (HCC) 09/16/2015  . HTN (hypertension) 09/11/2015  . Hypokalemia 09/11/2015    Patient's Medications  New Prescriptions   No medications on file  Previous Medications   ACETAMINOPHEN (TYLENOL) 325 MG TABLET    Take 2 tablets (650 mg total) by mouth every 6 (six) hours as needed for mild pain (or Fever >/= 101).   AMLODIPINE (NORVASC) 10 MG TABLET    Take 1 tablet (10 mg total) by mouth daily.   ASPIRIN EC 81 MG TABLET    Take 81 mg by mouth daily.   ATENOLOL (TENORMIN) 50 MG TABLET    Take 50 mg by mouth daily.    ATORVASTATIN (LIPITOR) 40 MG TABLET    Take 40 mg by mouth daily.    AZOPT 1 % OPHTHALMIC SUSPENSION    Place 1 drop into both eyes 3 (three) times daily.    DICLOFENAC (VOLTAREN) 75 MG EC TABLET    Take 75 mg by mouth 2 (two) times daily.    LATANOPROST (XALATAN) 0.005 % OPHTHALMIC SOLUTION    Place 1 drop into both eyes at bedtime.    LEVOFLOXACIN (LEVAQUIN) 750 MG TABLET    Take 1 tablet (750 mg total) by mouth daily at 12 noon.   MULTIPLE VITAMIN (MULTIVITAMIN WITH MINERALS) TABS TABLET    Take 1 tablet by mouth daily. Reported on 10/18/2015   ONDANSETRON (ZOFRAN) 4 MG TABLET    Take 1 tablet (4 mg total) by mouth every 6 (six) hours as needed for nausea.   SENNA-DOCUSATE (SENOKOT-S) 8.6-50 MG TABLET    Take 1 tablet by mouth at bedtime as needed for mild constipation.   TERAZOSIN (HYTRIN) 5 MG CAPSULE    Take 5 mg by mouth 2 (two) times daily.    TIMOLOL (TIMOPTIC-XR) 0.5 % OPHTHALMIC GEL-FORMING    Place 1 drop into both eyes at bedtime.   Modified Medications   No medications on file  Discontinued Medications   No medications on file    Subjective: [name] is in for his hospital  follow-up visit. He developed left ear pain, decreased hearing and some drainage from his left ear in February. He then had onset of acute low back pain leading to admission. He was found to have pneumococcal bacteremia and MRI evidence of acute lumbar infection at the L3-4 level. I elected to complete therapy with oral levofloxacin. He is now completed 37 days of total antibiotic therapy. He is feeling dramatically better. He is no longer having any ear pain, drainage and his hearing has returned to normal. His acute back pain has resolved. He is back to his baseline where he has some mild chronic low back discomfort for which he takes diclofenac. He is not requiring any supplemental pain medication. He was nonambulatory when he left the hospital but progressed to a walker and then a cane. He is no longer requiring a cane and has resumed his usual activities. He is planning to return to work this week. He has not had any fever, chills, sweats, nausea, vomiting or diarrhea.  Review of Systems: Review of Systems  Constitutional: Negative for fever, chills, weight loss, malaise/fatigue and  diaphoresis.  HENT: Negative for sore throat.   Respiratory: Negative for cough, sputum production and shortness of breath.   Cardiovascular: Negative for chest pain.  Gastrointestinal: Negative for nausea, vomiting and diarrhea.  Genitourinary: Negative for dysuria.  Musculoskeletal: Negative for myalgias, back pain and joint pain.  Skin: Positive for itching. Negative for rash.       He has had dry skin and itching on his back ever since he was discharged.  Neurological: Negative for dizziness, focal weakness and headaches.  Psychiatric/Behavioral: Negative for substance abuse.    No past medical history on file.  Social History  Substance Use Topics  . Smoking status: Former Smoker    Quit date: 09/09/1953  . Smokeless tobacco: Former Neurosurgeon    Types: Chew    Quit date: 09/09/1953  . Alcohol Use: Yes      Comment: every day 4 oz (bourbon) before dinner    No family history on file.  Allergies  Allergen Reactions  . Penicillins     Has patient had a PCN reaction causing immediate rash, facial/tongue/throat swelling, SOB or lightheadedness with hypotension: No Has patient had a PCN reaction causing severe rash involving mucus membranes or skin necrosis: No Has patient had a PCN reaction that required hospitalization No Has patient had a PCN reaction occurring within the last 10 years: No If all of the above answers are "NO", then may proceed with Cephalosporin use.     Objective: Filed Vitals:   10/18/15 1413  BP: 144/75  Pulse: 57  Temp: 97.7 F (36.5 C)  TempSrc: Oral  Weight: 219 lb (99.338 kg)   Body mass index is 31.42 kg/(m^2).  Physical Exam  Constitutional: He is oriented to person, place, and time.  He is smiling and in good spirits. He is accompanied by his daughter.  HENT:  Left Ear: External ear normal.  Mouth/Throat: No oropharyngeal exudate.  Eyes: Conjunctivae are normal.  Cardiovascular: Normal rate and regular rhythm.   No murmur heard. Pulmonary/Chest: Breath sounds normal.  Abdominal: Soft. There is no tenderness.  Musculoskeletal: Normal range of motion.  Neurological: He is alert and oriented to person, place, and time. Gait normal.  Skin: No rash noted.  Mild diffuse dry skin on back without rash.  Psychiatric: Mood and affect normal.    Lab Results SED RATE (mm/hr)  Date Value  09/15/2015 67*   CRP (mg/dL)  Date Value  16/04/9603 18.5*     Problem List Items Addressed This Visit      High   Discitis of lumbar region - Primary    He has had rapid and complete improvement on therapy for pneumococcal bacteremia and lumbar infection complicating left ear otitis media. He will complete 5 more days of levofloxacin. I will recheck his inflammatory markers today. He will follow-up with me in 6 weeks.      Relevant Orders   C-reactive protein    Sedimentation rate       Cliffton Asters, MD [facility] for Infectious Disease [facility] [phone] pager   [phone] cell 10/18/2015, 2:42 PM

## 2015-10-20 DIAGNOSIS — E785 Hyperlipidemia, unspecified: Secondary | ICD-10-CM | POA: Diagnosis not present

## 2015-10-20 DIAGNOSIS — I10 Essential (primary) hypertension: Secondary | ICD-10-CM | POA: Diagnosis not present

## 2015-10-20 DIAGNOSIS — M1612 Unilateral primary osteoarthritis, left hip: Secondary | ICD-10-CM | POA: Diagnosis not present

## 2015-10-20 DIAGNOSIS — M0088 Arthritis due to other bacteria, vertebrae: Secondary | ICD-10-CM | POA: Diagnosis not present

## 2015-10-20 DIAGNOSIS — A491 Streptococcal infection, unspecified site: Secondary | ICD-10-CM | POA: Diagnosis not present

## 2015-10-20 DIAGNOSIS — M4806 Spinal stenosis, lumbar region: Secondary | ICD-10-CM | POA: Diagnosis not present

## 2015-11-30 DIAGNOSIS — H401131 Primary open-angle glaucoma, bilateral, mild stage: Secondary | ICD-10-CM | POA: Diagnosis not present

## 2015-11-30 DIAGNOSIS — H2512 Age-related nuclear cataract, left eye: Secondary | ICD-10-CM | POA: Diagnosis not present

## 2015-12-02 ENCOUNTER — Ambulatory Visit: Payer: Medicare Other | Admitting: Internal Medicine

## 2015-12-17 DIAGNOSIS — Z125 Encounter for screening for malignant neoplasm of prostate: Secondary | ICD-10-CM | POA: Diagnosis not present

## 2015-12-17 DIAGNOSIS — E7800 Pure hypercholesterolemia, unspecified: Secondary | ICD-10-CM | POA: Diagnosis not present

## 2015-12-17 DIAGNOSIS — I10 Essential (primary) hypertension: Secondary | ICD-10-CM | POA: Diagnosis not present

## 2015-12-24 DIAGNOSIS — Z1389 Encounter for screening for other disorder: Secondary | ICD-10-CM | POA: Diagnosis not present

## 2015-12-24 DIAGNOSIS — Z0000 Encounter for general adult medical examination without abnormal findings: Secondary | ICD-10-CM | POA: Diagnosis not present

## 2015-12-24 DIAGNOSIS — I10 Essential (primary) hypertension: Secondary | ICD-10-CM | POA: Diagnosis not present

## 2015-12-24 DIAGNOSIS — H409 Unspecified glaucoma: Secondary | ICD-10-CM | POA: Diagnosis not present

## 2015-12-24 DIAGNOSIS — Z6832 Body mass index (BMI) 32.0-32.9, adult: Secondary | ICD-10-CM | POA: Diagnosis not present

## 2015-12-24 DIAGNOSIS — M1990 Unspecified osteoarthritis, unspecified site: Secondary | ICD-10-CM | POA: Diagnosis not present

## 2015-12-24 DIAGNOSIS — G061 Intraspinal abscess and granuloma: Secondary | ICD-10-CM | POA: Diagnosis not present

## 2015-12-24 DIAGNOSIS — E668 Other obesity: Secondary | ICD-10-CM | POA: Diagnosis not present

## 2015-12-24 DIAGNOSIS — R972 Elevated prostate specific antigen [PSA]: Secondary | ICD-10-CM | POA: Diagnosis not present

## 2015-12-24 DIAGNOSIS — D692 Other nonthrombocytopenic purpura: Secondary | ICD-10-CM | POA: Diagnosis not present

## 2015-12-24 DIAGNOSIS — H9190 Unspecified hearing loss, unspecified ear: Secondary | ICD-10-CM | POA: Diagnosis not present

## 2015-12-24 DIAGNOSIS — N401 Enlarged prostate with lower urinary tract symptoms: Secondary | ICD-10-CM | POA: Diagnosis not present

## 2016-05-30 DIAGNOSIS — Z961 Presence of intraocular lens: Secondary | ICD-10-CM | POA: Diagnosis not present

## 2016-05-30 DIAGNOSIS — H401122 Primary open-angle glaucoma, left eye, moderate stage: Secondary | ICD-10-CM | POA: Diagnosis not present

## 2016-05-30 DIAGNOSIS — H2512 Age-related nuclear cataract, left eye: Secondary | ICD-10-CM | POA: Diagnosis not present

## 2016-05-30 DIAGNOSIS — H401112 Primary open-angle glaucoma, right eye, moderate stage: Secondary | ICD-10-CM | POA: Diagnosis not present

## 2016-06-21 DIAGNOSIS — Z23 Encounter for immunization: Secondary | ICD-10-CM | POA: Diagnosis not present

## 2016-06-21 DIAGNOSIS — R972 Elevated prostate specific antigen [PSA]: Secondary | ICD-10-CM | POA: Diagnosis not present

## 2016-06-21 DIAGNOSIS — E668 Other obesity: Secondary | ICD-10-CM | POA: Diagnosis not present

## 2016-06-21 DIAGNOSIS — Z6833 Body mass index (BMI) 33.0-33.9, adult: Secondary | ICD-10-CM | POA: Diagnosis not present

## 2016-06-21 DIAGNOSIS — D698 Other specified hemorrhagic conditions: Secondary | ICD-10-CM | POA: Diagnosis not present

## 2016-06-21 DIAGNOSIS — M1990 Unspecified osteoarthritis, unspecified site: Secondary | ICD-10-CM | POA: Diagnosis not present

## 2016-06-21 DIAGNOSIS — I10 Essential (primary) hypertension: Secondary | ICD-10-CM | POA: Diagnosis not present

## 2016-06-21 DIAGNOSIS — N401 Enlarged prostate with lower urinary tract symptoms: Secondary | ICD-10-CM | POA: Diagnosis not present

## 2016-06-21 DIAGNOSIS — H4089 Other specified glaucoma: Secondary | ICD-10-CM | POA: Diagnosis not present

## 2016-06-21 DIAGNOSIS — E7800 Pure hypercholesterolemia, unspecified: Secondary | ICD-10-CM | POA: Diagnosis not present

## 2016-12-18 DIAGNOSIS — I10 Essential (primary) hypertension: Secondary | ICD-10-CM | POA: Diagnosis not present

## 2016-12-18 DIAGNOSIS — R8299 Other abnormal findings in urine: Secondary | ICD-10-CM | POA: Diagnosis not present

## 2016-12-18 DIAGNOSIS — R358 Other polyuria: Secondary | ICD-10-CM | POA: Diagnosis not present

## 2016-12-18 DIAGNOSIS — E7800 Pure hypercholesterolemia, unspecified: Secondary | ICD-10-CM | POA: Diagnosis not present

## 2016-12-18 DIAGNOSIS — Z125 Encounter for screening for malignant neoplasm of prostate: Secondary | ICD-10-CM | POA: Diagnosis not present

## 2016-12-25 DIAGNOSIS — H4089 Other specified glaucoma: Secondary | ICD-10-CM | POA: Diagnosis not present

## 2016-12-25 DIAGNOSIS — Z6833 Body mass index (BMI) 33.0-33.9, adult: Secondary | ICD-10-CM | POA: Diagnosis not present

## 2016-12-25 DIAGNOSIS — E7800 Pure hypercholesterolemia, unspecified: Secondary | ICD-10-CM | POA: Diagnosis not present

## 2016-12-25 DIAGNOSIS — R972 Elevated prostate specific antigen [PSA]: Secondary | ICD-10-CM | POA: Diagnosis not present

## 2016-12-25 DIAGNOSIS — G061 Intraspinal abscess and granuloma: Secondary | ICD-10-CM | POA: Diagnosis not present

## 2016-12-25 DIAGNOSIS — M1990 Unspecified osteoarthritis, unspecified site: Secondary | ICD-10-CM | POA: Diagnosis not present

## 2016-12-25 DIAGNOSIS — E668 Other obesity: Secondary | ICD-10-CM | POA: Diagnosis not present

## 2016-12-25 DIAGNOSIS — H9193 Unspecified hearing loss, bilateral: Secondary | ICD-10-CM | POA: Diagnosis not present

## 2016-12-25 DIAGNOSIS — N401 Enlarged prostate with lower urinary tract symptoms: Secondary | ICD-10-CM | POA: Diagnosis not present

## 2016-12-25 DIAGNOSIS — D692 Other nonthrombocytopenic purpura: Secondary | ICD-10-CM | POA: Diagnosis not present

## 2016-12-25 DIAGNOSIS — I10 Essential (primary) hypertension: Secondary | ICD-10-CM | POA: Diagnosis not present

## 2016-12-25 DIAGNOSIS — Z0000 Encounter for general adult medical examination without abnormal findings: Secondary | ICD-10-CM | POA: Diagnosis not present

## 2017-11-01 DIAGNOSIS — H5201 Hypermetropia, right eye: Secondary | ICD-10-CM | POA: Diagnosis not present

## 2017-11-01 DIAGNOSIS — H401112 Primary open-angle glaucoma, right eye, moderate stage: Secondary | ICD-10-CM | POA: Diagnosis not present

## 2017-12-20 DIAGNOSIS — Z125 Encounter for screening for malignant neoplasm of prostate: Secondary | ICD-10-CM | POA: Diagnosis not present

## 2017-12-20 DIAGNOSIS — R82998 Other abnormal findings in urine: Secondary | ICD-10-CM | POA: Diagnosis not present

## 2017-12-20 DIAGNOSIS — I10 Essential (primary) hypertension: Secondary | ICD-10-CM | POA: Diagnosis not present

## 2017-12-20 DIAGNOSIS — E7800 Pure hypercholesterolemia, unspecified: Secondary | ICD-10-CM | POA: Diagnosis not present

## 2017-12-26 DIAGNOSIS — Z0000 Encounter for general adult medical examination without abnormal findings: Secondary | ICD-10-CM | POA: Diagnosis not present

## 2017-12-26 DIAGNOSIS — M1990 Unspecified osteoarthritis, unspecified site: Secondary | ICD-10-CM | POA: Diagnosis not present

## 2017-12-26 DIAGNOSIS — H4089 Other specified glaucoma: Secondary | ICD-10-CM | POA: Diagnosis not present

## 2017-12-26 DIAGNOSIS — N401 Enlarged prostate with lower urinary tract symptoms: Secondary | ICD-10-CM | POA: Diagnosis not present

## 2017-12-26 DIAGNOSIS — R972 Elevated prostate specific antigen [PSA]: Secondary | ICD-10-CM | POA: Diagnosis not present

## 2017-12-26 DIAGNOSIS — Z6832 Body mass index (BMI) 32.0-32.9, adult: Secondary | ICD-10-CM | POA: Diagnosis not present

## 2017-12-26 DIAGNOSIS — D692 Other nonthrombocytopenic purpura: Secondary | ICD-10-CM | POA: Diagnosis not present

## 2017-12-26 DIAGNOSIS — Z1389 Encounter for screening for other disorder: Secondary | ICD-10-CM | POA: Diagnosis not present

## 2017-12-26 DIAGNOSIS — H9193 Unspecified hearing loss, bilateral: Secondary | ICD-10-CM | POA: Diagnosis not present

## 2017-12-26 DIAGNOSIS — I10 Essential (primary) hypertension: Secondary | ICD-10-CM | POA: Diagnosis not present

## 2017-12-26 DIAGNOSIS — E668 Other obesity: Secondary | ICD-10-CM | POA: Diagnosis not present

## 2017-12-26 DIAGNOSIS — E7800 Pure hypercholesterolemia, unspecified: Secondary | ICD-10-CM | POA: Diagnosis not present

## 2018-02-13 IMAGING — CR DG LUMBAR SPINE COMPLETE 4+V
5 series · 5 of 5 positions shown · non-contrast
Comparison: None.

CLINICAL DATA: Bilateral lumbosacral back pain, fever.

EXAM:
LUMBAR SPINE - COMPLETE 4+ VIEW

[t lumbar spine ap]
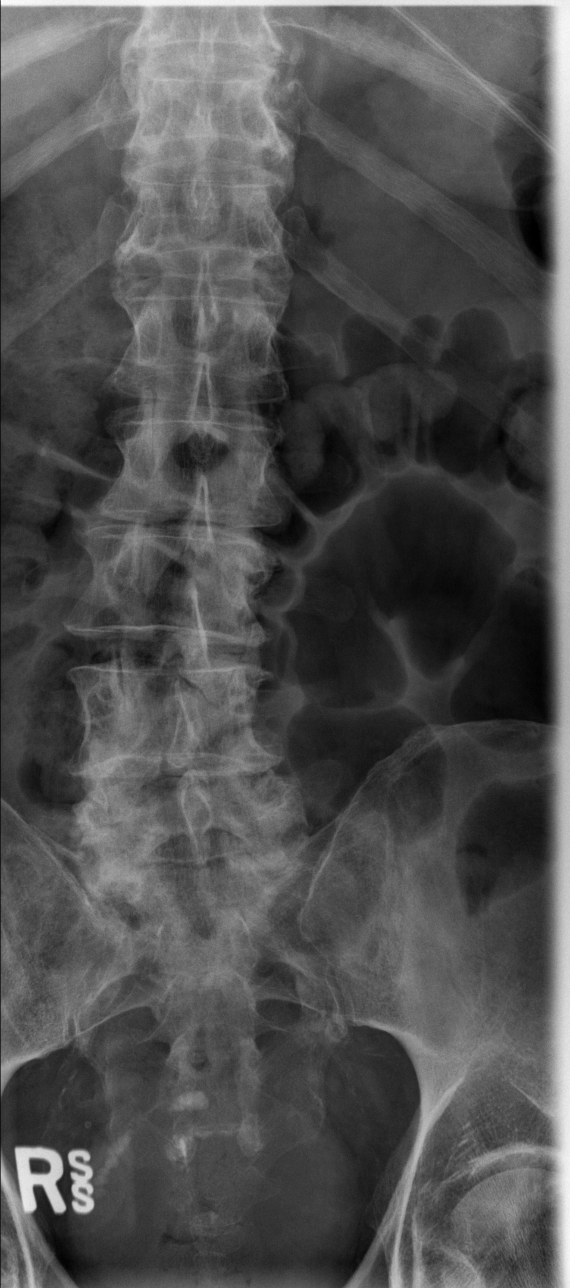

[t lumbar spine obl (1 of 2)]
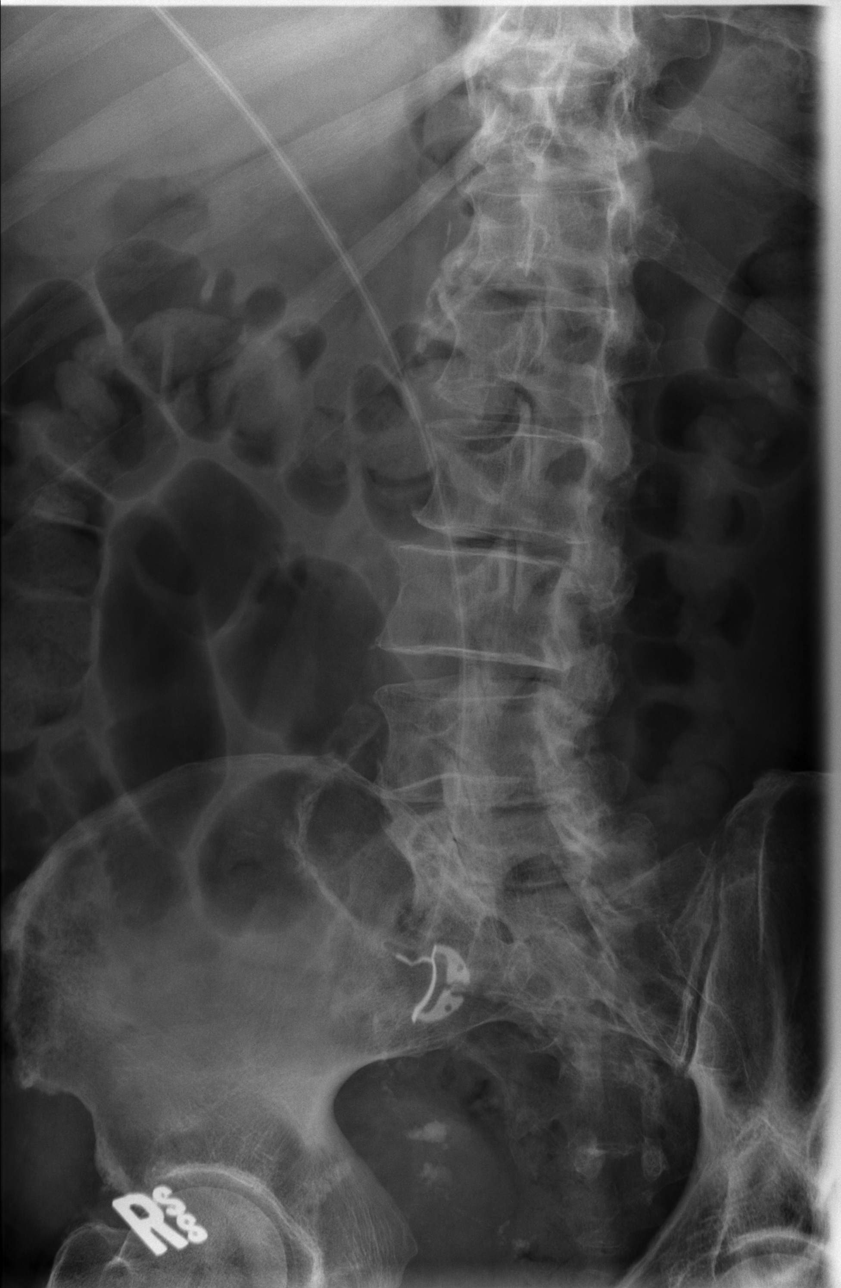

[t lumbar spine obl (2 of 2)]
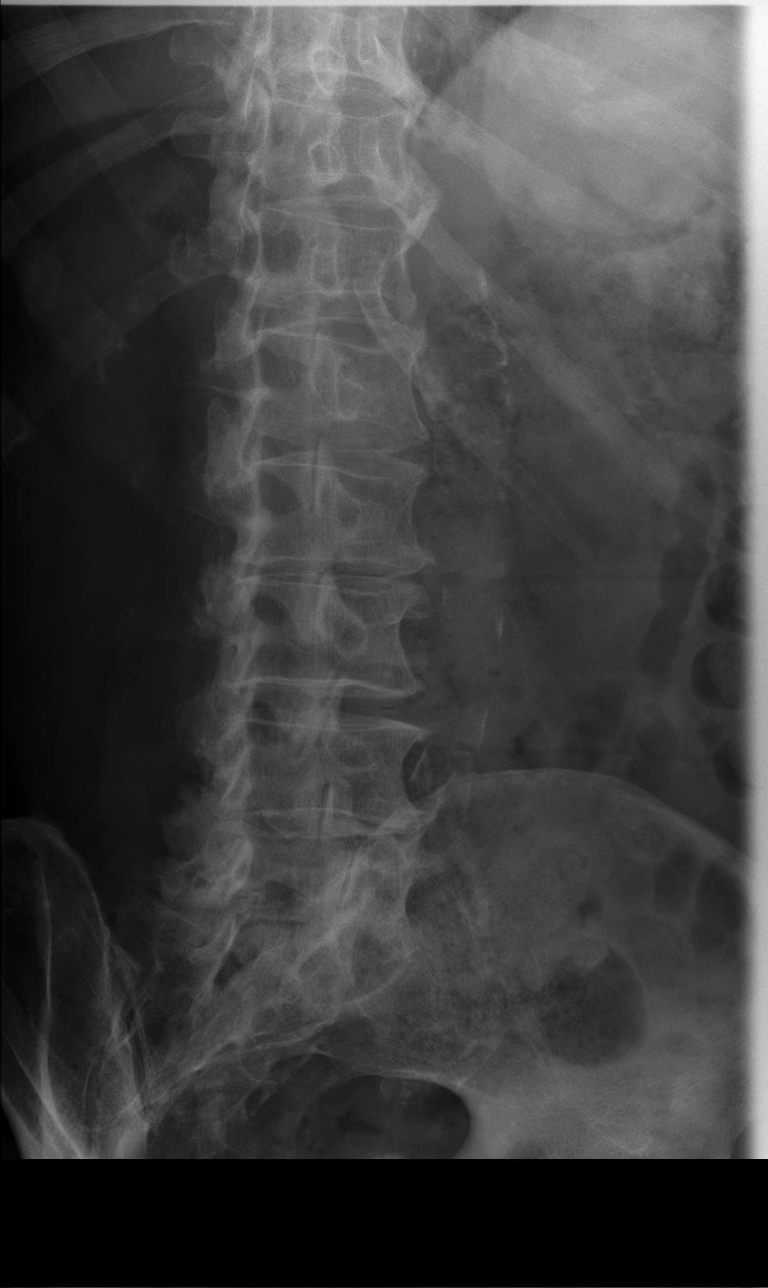

[t lumbar spine lat]
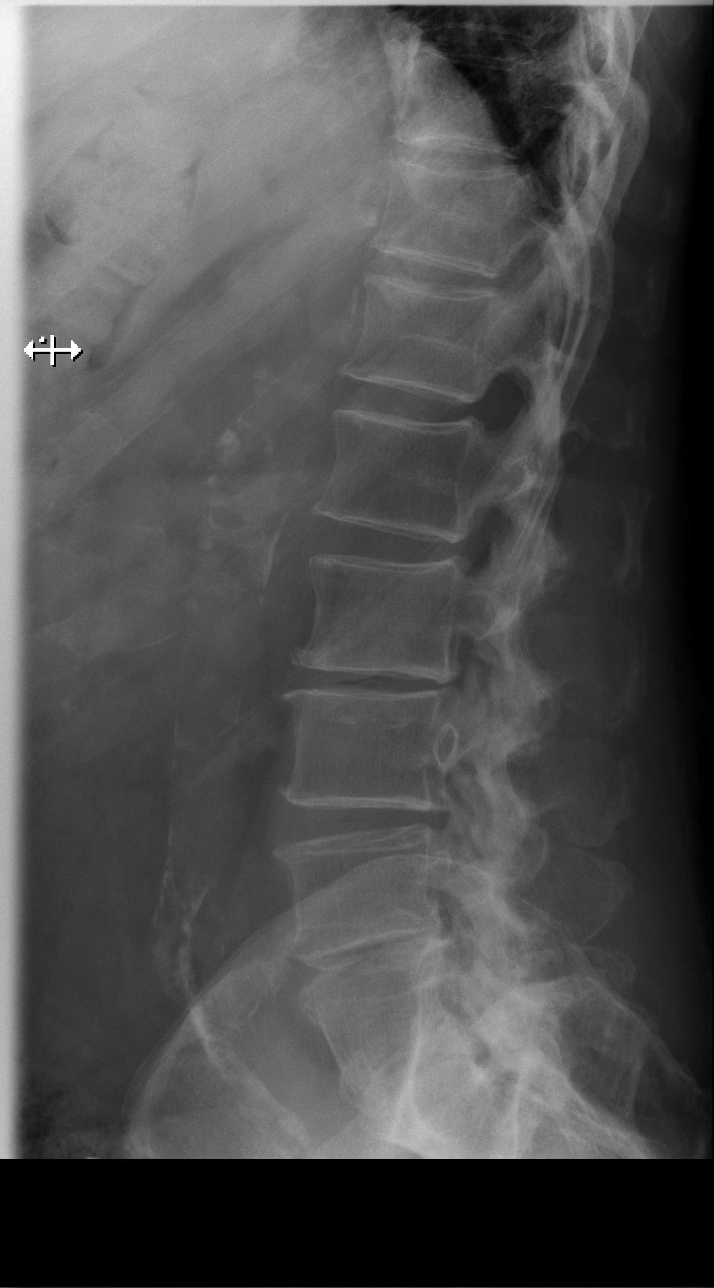

[t lumbar l-5 s-1 spot]
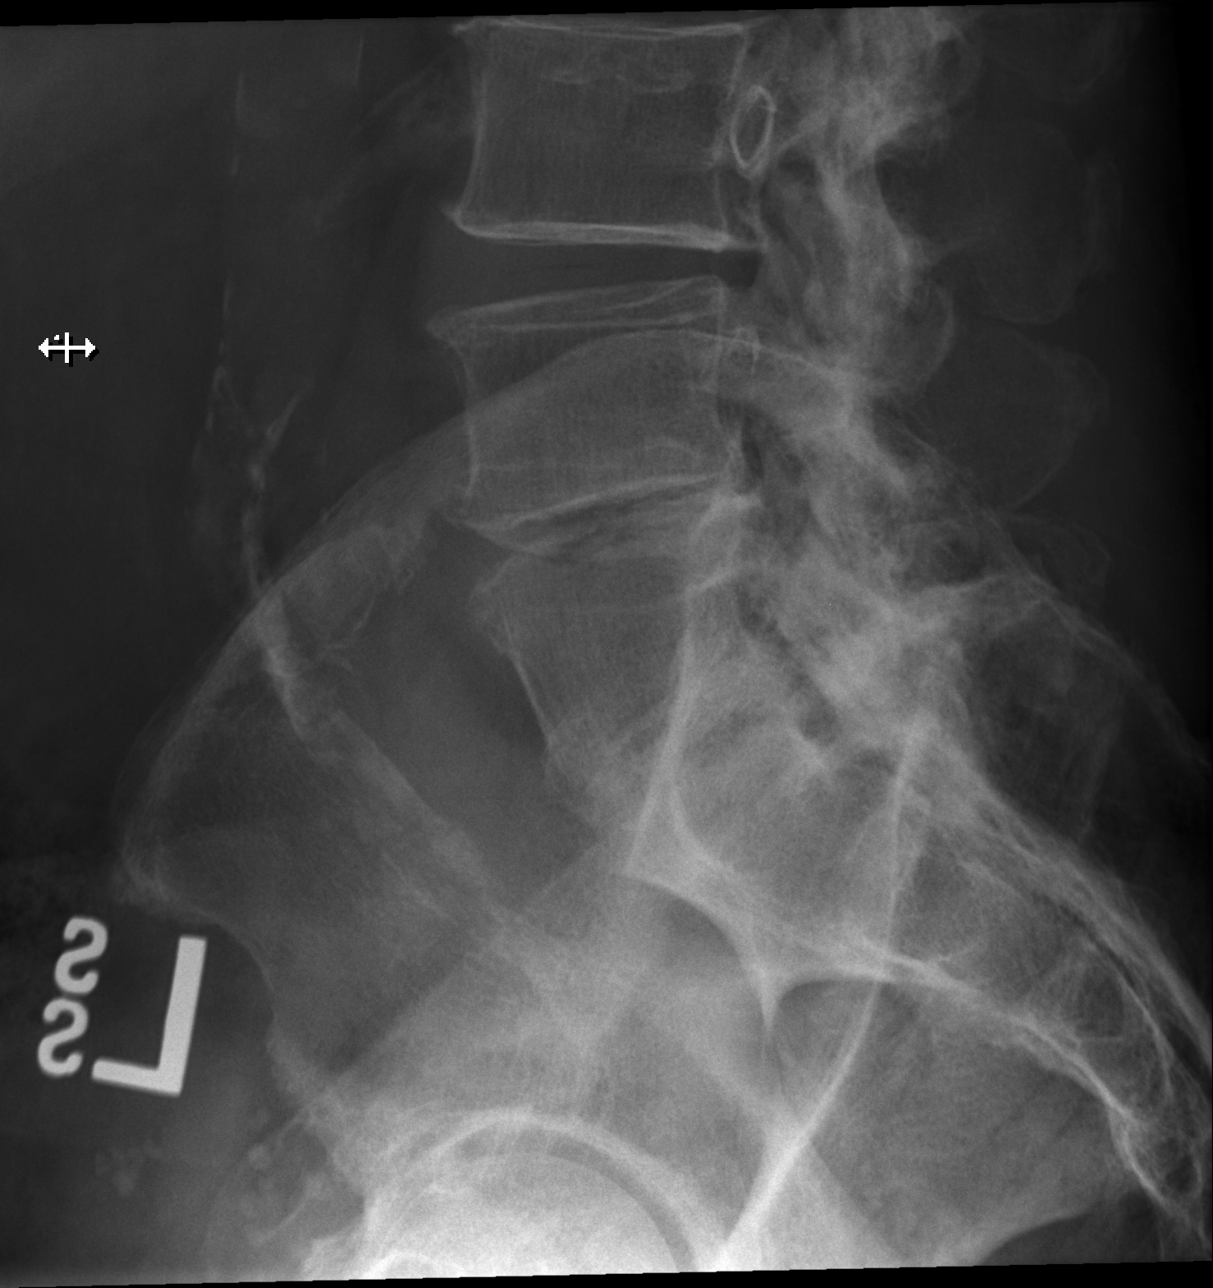

[5 of 5 positions shown; findings below may reference images not displayed]

FINDINGS: Mild broad-based rightward curvature of the spine. No fracture or
compression deformity. Disc space narrowing at L4-L5, L5-S1, and
L2-L3 with associated endplate spurring. There is facet arthropathy
throughout. Atherosclerosis of the abdominal aorta noted.
IMPRESSION: Degenerative disc disease and facet arthropathy with mild scoliosis.
No acute bony abnormality.

## 2018-03-07 DIAGNOSIS — H2512 Age-related nuclear cataract, left eye: Secondary | ICD-10-CM | POA: Diagnosis not present

## 2018-03-07 DIAGNOSIS — H401131 Primary open-angle glaucoma, bilateral, mild stage: Secondary | ICD-10-CM | POA: Diagnosis not present

## 2018-03-07 DIAGNOSIS — Z961 Presence of intraocular lens: Secondary | ICD-10-CM | POA: Diagnosis not present

## 2018-04-04 DIAGNOSIS — H401131 Primary open-angle glaucoma, bilateral, mild stage: Secondary | ICD-10-CM | POA: Diagnosis not present

## 2018-05-02 DIAGNOSIS — H401131 Primary open-angle glaucoma, bilateral, mild stage: Secondary | ICD-10-CM | POA: Diagnosis not present

## 2018-05-02 DIAGNOSIS — H35351 Cystoid macular degeneration, right eye: Secondary | ICD-10-CM | POA: Diagnosis not present

## 2018-05-14 DIAGNOSIS — H9313 Tinnitus, bilateral: Secondary | ICD-10-CM | POA: Diagnosis not present

## 2018-05-14 DIAGNOSIS — H903 Sensorineural hearing loss, bilateral: Secondary | ICD-10-CM | POA: Diagnosis not present

## 2018-05-14 DIAGNOSIS — Z570 Occupational exposure to noise: Secondary | ICD-10-CM | POA: Diagnosis not present

## 2018-05-20 DIAGNOSIS — H34831 Tributary (branch) retinal vein occlusion, right eye, with macular edema: Secondary | ICD-10-CM | POA: Diagnosis not present

## 2018-05-20 DIAGNOSIS — H35421 Microcystoid degeneration of retina, right eye: Secondary | ICD-10-CM | POA: Diagnosis not present

## 2018-05-20 DIAGNOSIS — H35033 Hypertensive retinopathy, bilateral: Secondary | ICD-10-CM | POA: Diagnosis not present

## 2018-05-20 DIAGNOSIS — H3561 Retinal hemorrhage, right eye: Secondary | ICD-10-CM | POA: Diagnosis not present

## 2018-05-23 DIAGNOSIS — Z23 Encounter for immunization: Secondary | ICD-10-CM | POA: Diagnosis not present

## 2018-06-20 DIAGNOSIS — H43393 Other vitreous opacities, bilateral: Secondary | ICD-10-CM | POA: Diagnosis not present

## 2018-06-20 DIAGNOSIS — H3561 Retinal hemorrhage, right eye: Secondary | ICD-10-CM | POA: Diagnosis not present

## 2018-06-20 DIAGNOSIS — H35033 Hypertensive retinopathy, bilateral: Secondary | ICD-10-CM | POA: Diagnosis not present

## 2018-06-20 DIAGNOSIS — H34831 Tributary (branch) retinal vein occlusion, right eye, with macular edema: Secondary | ICD-10-CM | POA: Diagnosis not present

## 2018-07-01 DIAGNOSIS — M545 Low back pain: Secondary | ICD-10-CM | POA: Diagnosis not present

## 2018-07-01 DIAGNOSIS — W1830XA Fall on same level, unspecified, initial encounter: Secondary | ICD-10-CM | POA: Diagnosis not present

## 2018-07-04 ENCOUNTER — Other Ambulatory Visit: Payer: Self-pay | Admitting: Family Medicine

## 2018-07-04 DIAGNOSIS — Z6831 Body mass index (BMI) 31.0-31.9, adult: Secondary | ICD-10-CM | POA: Diagnosis not present

## 2018-07-04 DIAGNOSIS — M5136 Other intervertebral disc degeneration, lumbar region: Secondary | ICD-10-CM | POA: Diagnosis not present

## 2018-07-04 DIAGNOSIS — M545 Low back pain, unspecified: Secondary | ICD-10-CM

## 2018-07-04 DIAGNOSIS — I10 Essential (primary) hypertension: Secondary | ICD-10-CM | POA: Diagnosis not present

## 2018-07-04 DIAGNOSIS — M4856XA Collapsed vertebra, not elsewhere classified, lumbar region, initial encounter for fracture: Secondary | ICD-10-CM | POA: Diagnosis not present

## 2018-07-16 ENCOUNTER — Ambulatory Visit
Admission: RE | Admit: 2018-07-16 | Discharge: 2018-07-16 | Disposition: A | Discharge status: Home / Self Care | Payer: Medicare Other | Source: Ambulatory Visit | Attending: Family Medicine | Admitting: Family Medicine

## 2018-07-16 DIAGNOSIS — M5136 Other intervertebral disc degeneration, lumbar region: Secondary | ICD-10-CM

## 2018-07-16 DIAGNOSIS — M545 Low back pain, unspecified: Secondary | ICD-10-CM

## 2018-07-16 DIAGNOSIS — S3992XA Unspecified injury of lower back, initial encounter: Secondary | ICD-10-CM | POA: Diagnosis not present

## 2018-07-18 DIAGNOSIS — H34831 Tributary (branch) retinal vein occlusion, right eye, with macular edema: Secondary | ICD-10-CM | POA: Diagnosis not present

## 2018-08-19 DIAGNOSIS — M5106 Intervertebral disc disorders with myelopathy, lumbar region: Secondary | ICD-10-CM | POA: Diagnosis not present

## 2018-08-22 DIAGNOSIS — H34831 Tributary (branch) retinal vein occlusion, right eye, with macular edema: Secondary | ICD-10-CM | POA: Diagnosis not present

## 2018-08-22 DIAGNOSIS — H35033 Hypertensive retinopathy, bilateral: Secondary | ICD-10-CM | POA: Diagnosis not present

## 2018-08-22 DIAGNOSIS — H43811 Vitreous degeneration, right eye: Secondary | ICD-10-CM | POA: Diagnosis not present

## 2018-08-22 DIAGNOSIS — H43393 Other vitreous opacities, bilateral: Secondary | ICD-10-CM | POA: Diagnosis not present

## 2018-09-17 DIAGNOSIS — S32010A Wedge compression fracture of first lumbar vertebra, initial encounter for closed fracture: Secondary | ICD-10-CM | POA: Diagnosis not present

## 2018-09-19 DIAGNOSIS — M545 Low back pain: Secondary | ICD-10-CM | POA: Diagnosis not present

## 2018-09-19 DIAGNOSIS — S32010A Wedge compression fracture of first lumbar vertebra, initial encounter for closed fracture: Secondary | ICD-10-CM | POA: Diagnosis not present

## 2018-09-24 DIAGNOSIS — S32010A Wedge compression fracture of first lumbar vertebra, initial encounter for closed fracture: Secondary | ICD-10-CM | POA: Diagnosis not present

## 2018-09-26 DIAGNOSIS — H34831 Tributary (branch) retinal vein occlusion, right eye, with macular edema: Secondary | ICD-10-CM | POA: Diagnosis not present

## 2018-09-26 DIAGNOSIS — H35031 Hypertensive retinopathy, right eye: Secondary | ICD-10-CM | POA: Diagnosis not present

## 2018-09-26 DIAGNOSIS — H43811 Vitreous degeneration, right eye: Secondary | ICD-10-CM | POA: Diagnosis not present

## 2018-09-26 DIAGNOSIS — H35421 Microcystoid degeneration of retina, right eye: Secondary | ICD-10-CM | POA: Diagnosis not present

## 2018-10-28 DIAGNOSIS — H34831 Tributary (branch) retinal vein occlusion, right eye, with macular edema: Secondary | ICD-10-CM | POA: Diagnosis not present

## 2018-12-02 DIAGNOSIS — H34831 Tributary (branch) retinal vein occlusion, right eye, with macular edema: Secondary | ICD-10-CM | POA: Diagnosis not present

## 2018-12-26 DIAGNOSIS — E7800 Pure hypercholesterolemia, unspecified: Secondary | ICD-10-CM | POA: Diagnosis not present

## 2018-12-26 DIAGNOSIS — I10 Essential (primary) hypertension: Secondary | ICD-10-CM | POA: Diagnosis not present

## 2018-12-26 DIAGNOSIS — Z125 Encounter for screening for malignant neoplasm of prostate: Secondary | ICD-10-CM | POA: Diagnosis not present

## 2019-01-02 DIAGNOSIS — R972 Elevated prostate specific antigen [PSA]: Secondary | ICD-10-CM | POA: Diagnosis not present

## 2019-01-02 DIAGNOSIS — H9190 Unspecified hearing loss, unspecified ear: Secondary | ICD-10-CM | POA: Diagnosis not present

## 2019-01-02 DIAGNOSIS — Z1339 Encounter for screening examination for other mental health and behavioral disorders: Secondary | ICD-10-CM | POA: Diagnosis not present

## 2019-01-02 DIAGNOSIS — K5900 Constipation, unspecified: Secondary | ICD-10-CM | POA: Diagnosis not present

## 2019-01-02 DIAGNOSIS — Z1331 Encounter for screening for depression: Secondary | ICD-10-CM | POA: Diagnosis not present

## 2019-01-02 DIAGNOSIS — K219 Gastro-esophageal reflux disease without esophagitis: Secondary | ICD-10-CM | POA: Diagnosis not present

## 2019-01-02 DIAGNOSIS — D692 Other nonthrombocytopenic purpura: Secondary | ICD-10-CM | POA: Diagnosis not present

## 2019-01-02 DIAGNOSIS — N401 Enlarged prostate with lower urinary tract symptoms: Secondary | ICD-10-CM | POA: Diagnosis not present

## 2019-01-02 DIAGNOSIS — E7800 Pure hypercholesterolemia, unspecified: Secondary | ICD-10-CM | POA: Diagnosis not present

## 2019-01-02 DIAGNOSIS — I10 Essential (primary) hypertension: Secondary | ICD-10-CM | POA: Diagnosis not present

## 2019-01-02 DIAGNOSIS — M5136 Other intervertebral disc degeneration, lumbar region: Secondary | ICD-10-CM | POA: Diagnosis not present

## 2019-01-02 DIAGNOSIS — Z0000 Encounter for general adult medical examination without abnormal findings: Secondary | ICD-10-CM | POA: Diagnosis not present

## 2019-01-03 DIAGNOSIS — I10 Essential (primary) hypertension: Secondary | ICD-10-CM | POA: Diagnosis not present

## 2019-01-03 DIAGNOSIS — R82998 Other abnormal findings in urine: Secondary | ICD-10-CM | POA: Diagnosis not present

## 2019-01-17 DIAGNOSIS — H43811 Vitreous degeneration, right eye: Secondary | ICD-10-CM | POA: Diagnosis not present

## 2019-01-17 DIAGNOSIS — H43391 Other vitreous opacities, right eye: Secondary | ICD-10-CM | POA: Diagnosis not present

## 2019-01-17 DIAGNOSIS — H35031 Hypertensive retinopathy, right eye: Secondary | ICD-10-CM | POA: Diagnosis not present

## 2019-01-17 DIAGNOSIS — H34831 Tributary (branch) retinal vein occlusion, right eye, with macular edema: Secondary | ICD-10-CM | POA: Diagnosis not present

## 2019-02-27 DIAGNOSIS — H34831 Tributary (branch) retinal vein occlusion, right eye, with macular edema: Secondary | ICD-10-CM | POA: Diagnosis not present

## 2019-02-27 DIAGNOSIS — H3561 Retinal hemorrhage, right eye: Secondary | ICD-10-CM | POA: Diagnosis not present

## 2019-02-27 DIAGNOSIS — H43811 Vitreous degeneration, right eye: Secondary | ICD-10-CM | POA: Diagnosis not present

## 2019-02-27 DIAGNOSIS — H35031 Hypertensive retinopathy, right eye: Secondary | ICD-10-CM | POA: Diagnosis not present

## 2019-04-14 DIAGNOSIS — H524 Presbyopia: Secondary | ICD-10-CM | POA: Diagnosis not present

## 2019-04-14 DIAGNOSIS — H401131 Primary open-angle glaucoma, bilateral, mild stage: Secondary | ICD-10-CM | POA: Diagnosis not present

## 2019-04-21 DIAGNOSIS — H34831 Tributary (branch) retinal vein occlusion, right eye, with macular edema: Secondary | ICD-10-CM | POA: Diagnosis not present

## 2019-06-16 DIAGNOSIS — H34831 Tributary (branch) retinal vein occlusion, right eye, with macular edema: Secondary | ICD-10-CM | POA: Diagnosis not present

## 2019-06-16 DIAGNOSIS — H2522 Age-related cataract, morgagnian type, left eye: Secondary | ICD-10-CM | POA: Diagnosis not present

## 2019-06-16 DIAGNOSIS — H35033 Hypertensive retinopathy, bilateral: Secondary | ICD-10-CM | POA: Diagnosis not present

## 2019-06-16 DIAGNOSIS — H43811 Vitreous degeneration, right eye: Secondary | ICD-10-CM | POA: Diagnosis not present

## 2019-06-19 DIAGNOSIS — Z23 Encounter for immunization: Secondary | ICD-10-CM | POA: Diagnosis not present

## 2019-08-13 ENCOUNTER — Ambulatory Visit: Payer: Medicare Other

## 2019-08-14 DIAGNOSIS — H401131 Primary open-angle glaucoma, bilateral, mild stage: Secondary | ICD-10-CM | POA: Diagnosis not present

## 2019-08-14 DIAGNOSIS — H348112 Central retinal vein occlusion, right eye, stable: Secondary | ICD-10-CM | POA: Diagnosis not present

## 2019-08-21 ENCOUNTER — Ambulatory Visit: Payer: Medicare Other | Attending: Internal Medicine

## 2019-08-21 DIAGNOSIS — Z23 Encounter for immunization: Secondary | ICD-10-CM | POA: Insufficient documentation

## 2019-08-21 NOTE — Progress Notes (Signed)
   Covid-19 Vaccination Clinic  Name:  Alexander Diaz    MRN: [id number] DOB: 1929/06/13  08/21/2019  Alexander Diaz was observed post Covid-19 immunization for 15 minutes without incidence. He was provided with Vaccine Information Sheet and instruction to access the V-Safe system.   Alexander Diaz was instructed to call 911 with any severe reactions post vaccine: Marland Kitchen Difficulty breathing  . Swelling of your face and throat  . A fast heartbeat  . A bad rash all over your body  . Dizziness and weakness    Immunizations Administered    Name Date Dose VIS Date Route   Pfizer COVID-19 Vaccine 08/21/2019  1:04 PM 0.3 mL 06/27/2019 Intramuscular   Manufacturer: [employer], [employer]   Lot: [id number]   NDC: [address]-6

## 2019-08-25 DIAGNOSIS — H3582 Retinal ischemia: Secondary | ICD-10-CM | POA: Diagnosis not present

## 2019-08-25 DIAGNOSIS — H35031 Hypertensive retinopathy, right eye: Secondary | ICD-10-CM | POA: Diagnosis not present

## 2019-08-25 DIAGNOSIS — H34831 Tributary (branch) retinal vein occlusion, right eye, with macular edema: Secondary | ICD-10-CM | POA: Diagnosis not present

## 2019-08-25 DIAGNOSIS — H2522 Age-related cataract, morgagnian type, left eye: Secondary | ICD-10-CM | POA: Diagnosis not present

## 2019-08-30 ENCOUNTER — Ambulatory Visit: Payer: Medicare Other

## 2019-09-15 ENCOUNTER — Ambulatory Visit: Payer: Medicare Other | Attending: Internal Medicine

## 2019-09-15 DIAGNOSIS — Z23 Encounter for immunization: Secondary | ICD-10-CM | POA: Insufficient documentation

## 2019-09-15 NOTE — Progress Notes (Signed)
   Covid-19 Vaccination Clinic  Name:  Alexander Diaz    MRN: [id number] DOB: 1928-08-03  09/15/2019  [name] was observed post Covid-19 immunization for 15 minutes without incidence. He was provided with Vaccine Information Sheet and instruction to access the V-Safe system.   [name] was instructed to call 911 with any severe reactions post vaccine: Marland Kitchen Difficulty breathing  . Swelling of your face and throat  . A fast heartbeat  . A bad rash all over your body  . Dizziness and weakness    Immunizations Administered    Name Date Dose VIS Date Route   Pfizer COVID-19 Vaccine 09/15/2019  3:56 PM 0.3 mL 06/27/2019 Intramuscular   Manufacturer: [employer], [employer]   Lot: [id number]   NDC: [address]-3

## 2019-09-29 DIAGNOSIS — H26491 Other secondary cataract, right eye: Secondary | ICD-10-CM | POA: Diagnosis not present

## 2019-11-06 DIAGNOSIS — H35033 Hypertensive retinopathy, bilateral: Secondary | ICD-10-CM | POA: Diagnosis not present

## 2019-11-06 DIAGNOSIS — H34831 Tributary (branch) retinal vein occlusion, right eye, with macular edema: Secondary | ICD-10-CM | POA: Diagnosis not present

## 2019-11-06 DIAGNOSIS — H3582 Retinal ischemia: Secondary | ICD-10-CM | POA: Diagnosis not present

## 2019-11-06 DIAGNOSIS — H43811 Vitreous degeneration, right eye: Secondary | ICD-10-CM | POA: Diagnosis not present

## 2019-11-10 DIAGNOSIS — R300 Dysuria: Secondary | ICD-10-CM | POA: Diagnosis not present

## 2019-11-12 DIAGNOSIS — H26491 Other secondary cataract, right eye: Secondary | ICD-10-CM | POA: Diagnosis not present

## 2019-12-29 DIAGNOSIS — E7800 Pure hypercholesterolemia, unspecified: Secondary | ICD-10-CM | POA: Diagnosis not present

## 2019-12-29 DIAGNOSIS — Z125 Encounter for screening for malignant neoplasm of prostate: Secondary | ICD-10-CM | POA: Diagnosis not present

## 2020-01-05 ENCOUNTER — Other Ambulatory Visit: Payer: Self-pay

## 2020-01-05 ENCOUNTER — Other Ambulatory Visit (HOSPITAL_COMMUNITY): Payer: Self-pay | Admitting: Internal Medicine

## 2020-01-05 ENCOUNTER — Ambulatory Visit (HOSPITAL_COMMUNITY)
Admission: RE | Admit: 2020-01-05 | Discharge: 2020-01-05 | Disposition: A | Discharge status: Home / Self Care | Payer: Medicare Other | Source: Ambulatory Visit | Attending: Vascular Surgery | Admitting: Vascular Surgery

## 2020-01-05 DIAGNOSIS — H349 Unspecified retinal vascular occlusion: Secondary | ICD-10-CM

## 2020-01-28 DIAGNOSIS — D692 Other nonthrombocytopenic purpura: Secondary | ICD-10-CM | POA: Diagnosis not present

## 2020-01-28 DIAGNOSIS — H9190 Unspecified hearing loss, unspecified ear: Secondary | ICD-10-CM | POA: Diagnosis not present

## 2020-01-28 DIAGNOSIS — H349 Unspecified retinal vascular occlusion: Secondary | ICD-10-CM | POA: Diagnosis not present

## 2020-01-28 DIAGNOSIS — Z8619 Personal history of other infectious and parasitic diseases: Secondary | ICD-10-CM | POA: Diagnosis not present

## 2020-01-28 DIAGNOSIS — N401 Enlarged prostate with lower urinary tract symptoms: Secondary | ICD-10-CM | POA: Diagnosis not present

## 2020-01-28 DIAGNOSIS — H548 Legal blindness, as defined in USA: Secondary | ICD-10-CM | POA: Diagnosis not present

## 2020-01-28 DIAGNOSIS — M5136 Other intervertebral disc degeneration, lumbar region: Secondary | ICD-10-CM | POA: Diagnosis not present

## 2020-01-28 DIAGNOSIS — Z7982 Long term (current) use of aspirin: Secondary | ICD-10-CM | POA: Diagnosis not present

## 2020-01-28 DIAGNOSIS — R300 Dysuria: Secondary | ICD-10-CM | POA: Diagnosis not present

## 2020-01-28 DIAGNOSIS — M1990 Unspecified osteoarthritis, unspecified site: Secondary | ICD-10-CM | POA: Diagnosis not present

## 2020-01-28 DIAGNOSIS — Z6824 Body mass index (BMI) 24.0-24.9, adult: Secondary | ICD-10-CM | POA: Diagnosis not present

## 2020-01-28 DIAGNOSIS — K219 Gastro-esophageal reflux disease without esophagitis: Secondary | ICD-10-CM | POA: Diagnosis not present

## 2020-01-28 DIAGNOSIS — I10 Essential (primary) hypertension: Secondary | ICD-10-CM | POA: Diagnosis not present

## 2020-01-28 DIAGNOSIS — Z87891 Personal history of nicotine dependence: Secondary | ICD-10-CM | POA: Diagnosis not present

## 2020-01-28 DIAGNOSIS — E7800 Pure hypercholesterolemia, unspecified: Secondary | ICD-10-CM | POA: Diagnosis not present

## 2020-01-28 DIAGNOSIS — K5900 Constipation, unspecified: Secondary | ICD-10-CM | POA: Diagnosis not present

## 2020-01-28 DIAGNOSIS — E785 Hyperlipidemia, unspecified: Secondary | ICD-10-CM | POA: Diagnosis not present

## 2020-01-28 DIAGNOSIS — H409 Unspecified glaucoma: Secondary | ICD-10-CM | POA: Diagnosis not present

## 2020-01-28 DIAGNOSIS — E43 Unspecified severe protein-calorie malnutrition: Secondary | ICD-10-CM | POA: Diagnosis not present

## 2020-01-28 DIAGNOSIS — M4856XD Collapsed vertebra, not elsewhere classified, lumbar region, subsequent encounter for fracture with routine healing: Secondary | ICD-10-CM | POA: Diagnosis not present

## 2020-02-04 DIAGNOSIS — H349 Unspecified retinal vascular occlusion: Secondary | ICD-10-CM | POA: Diagnosis not present

## 2020-02-04 DIAGNOSIS — H548 Legal blindness, as defined in USA: Secondary | ICD-10-CM | POA: Diagnosis not present

## 2020-02-04 DIAGNOSIS — M1990 Unspecified osteoarthritis, unspecified site: Secondary | ICD-10-CM | POA: Diagnosis not present

## 2020-02-04 DIAGNOSIS — I10 Essential (primary) hypertension: Secondary | ICD-10-CM | POA: Diagnosis not present

## 2020-02-04 DIAGNOSIS — K219 Gastro-esophageal reflux disease without esophagitis: Secondary | ICD-10-CM | POA: Diagnosis not present

## 2020-02-04 DIAGNOSIS — E43 Unspecified severe protein-calorie malnutrition: Secondary | ICD-10-CM | POA: Diagnosis not present

## 2020-02-12 DIAGNOSIS — M1990 Unspecified osteoarthritis, unspecified site: Secondary | ICD-10-CM | POA: Diagnosis not present

## 2020-02-12 DIAGNOSIS — H349 Unspecified retinal vascular occlusion: Secondary | ICD-10-CM | POA: Diagnosis not present

## 2020-02-12 DIAGNOSIS — K219 Gastro-esophageal reflux disease without esophagitis: Secondary | ICD-10-CM | POA: Diagnosis not present

## 2020-02-12 DIAGNOSIS — I10 Essential (primary) hypertension: Secondary | ICD-10-CM | POA: Diagnosis not present

## 2020-02-12 DIAGNOSIS — H548 Legal blindness, as defined in USA: Secondary | ICD-10-CM | POA: Diagnosis not present

## 2020-02-12 DIAGNOSIS — E43 Unspecified severe protein-calorie malnutrition: Secondary | ICD-10-CM | POA: Diagnosis not present

## 2020-02-18 DIAGNOSIS — E43 Unspecified severe protein-calorie malnutrition: Secondary | ICD-10-CM | POA: Diagnosis not present

## 2020-02-18 DIAGNOSIS — H349 Unspecified retinal vascular occlusion: Secondary | ICD-10-CM | POA: Diagnosis not present

## 2020-02-18 DIAGNOSIS — I10 Essential (primary) hypertension: Secondary | ICD-10-CM | POA: Diagnosis not present

## 2020-02-18 DIAGNOSIS — H548 Legal blindness, as defined in USA: Secondary | ICD-10-CM | POA: Diagnosis not present

## 2020-02-18 DIAGNOSIS — K219 Gastro-esophageal reflux disease without esophagitis: Secondary | ICD-10-CM | POA: Diagnosis not present

## 2020-02-18 DIAGNOSIS — M1990 Unspecified osteoarthritis, unspecified site: Secondary | ICD-10-CM | POA: Diagnosis not present

## 2020-02-20 ENCOUNTER — Emergency Department (HOSPITAL_COMMUNITY)
Admission: EM | Admit: 2020-02-20 | Discharge: 2020-02-20 | Disposition: A | Discharge status: Home / Self Care | Payer: Medicare Other | Attending: Emergency Medicine | Admitting: Emergency Medicine

## 2020-02-20 ENCOUNTER — Other Ambulatory Visit: Payer: Self-pay

## 2020-02-20 ENCOUNTER — Encounter (HOSPITAL_COMMUNITY): Payer: Self-pay | Admitting: *Deleted

## 2020-02-20 DIAGNOSIS — Z79899 Other long term (current) drug therapy: Secondary | ICD-10-CM | POA: Diagnosis not present

## 2020-02-20 DIAGNOSIS — R109 Unspecified abdominal pain: Secondary | ICD-10-CM | POA: Diagnosis not present

## 2020-02-20 DIAGNOSIS — Z87891 Personal history of nicotine dependence: Secondary | ICD-10-CM | POA: Insufficient documentation

## 2020-02-20 DIAGNOSIS — R39198 Other difficulties with micturition: Secondary | ICD-10-CM | POA: Insufficient documentation

## 2020-02-20 DIAGNOSIS — R339 Retention of urine, unspecified: Secondary | ICD-10-CM | POA: Diagnosis not present

## 2020-02-20 DIAGNOSIS — R600 Localized edema: Secondary | ICD-10-CM | POA: Diagnosis not present

## 2020-02-20 DIAGNOSIS — I10 Essential (primary) hypertension: Secondary | ICD-10-CM | POA: Diagnosis not present

## 2020-02-20 DIAGNOSIS — Z7982 Long term (current) use of aspirin: Secondary | ICD-10-CM | POA: Insufficient documentation

## 2020-02-20 HISTORY — DX: Disorder of kidney and ureter, unspecified: N28.9

## 2020-02-20 LAB — URINALYSIS, ROUTINE W REFLEX MICROSCOPIC
Bilirubin Urine: NEGATIVE
Glucose, UA: NEGATIVE mg/dL
Hgb urine dipstick: NEGATIVE
Ketones, ur: NEGATIVE mg/dL
Leukocytes,Ua: NEGATIVE
Nitrite: NEGATIVE
Protein, ur: NEGATIVE mg/dL
Specific Gravity, Urine: 1.012 (ref 1.005–1.030)
pH: 6 (ref 5.0–8.0)

## 2020-02-20 LAB — I-STAT CHEM 8, ED
BUN: 18 mg/dL (ref 8–23)
Calcium, Ion: 1.29 mmol/L (ref 1.15–1.40)
Chloride: 99 mmol/L (ref 98–111)
Creatinine, Ser: 1.2 mg/dL (ref 0.61–1.24)
Glucose, Bld: 114 mg/dL — ABNORMAL HIGH (ref 70–99)
HCT: 42 % (ref 39.0–52.0)
Hemoglobin: 14.3 g/dL (ref 13.0–17.0)
Potassium: 4.5 mmol/L (ref 3.5–5.1)
Sodium: 137 mmol/L (ref 135–145)
TCO2: 24 mmol/L (ref 22–32)

## 2020-02-20 NOTE — ED Notes (Signed)
Instructed on how to empty and secure bag

## 2020-02-20 NOTE — ED Provider Notes (Signed)
[facility]-EMERGENCY DEPT Provider Note   CSN: [id number] Arrival date & time: 02/20/20  1001     History Chief Complaint  Patient presents with  . Urinary Retention    Alexander Diaz is a 86 y.o. male.  HPI   Patient presents to the emergency department with chief complaint of urinary retention.  Patient explains he has been unable to urinate since yesterday afternoon and states he has had some abdominal pain since then.  He denies fever, chills, worsening lower back pain, nausea, vomiting, pedal edema.  He states he has a history of kidney stones as well as enlarged prostate but has not had any issues with urinary retention.  He denies any sort of alleviating or aggravating factors.  Patient denies headache, fever, chills, shortness of breath, chest pain, nausea, vomiting, diarrhea, pedal edema.  Past Medical History:  Diagnosis Date  . Renal disorder     Patient Active Problem List   Diagnosis Date Noted  . AKI (acute kidney injury) (HCC) 09/16/2015  . Discitis of lumbar region 09/12/2015  . HTN (hypertension) 09/11/2015  . Hypokalemia 09/11/2015  . Pneumococcal bacteremia 09/11/2015  . Otitis media     History reviewed. No pertinent surgical history.     No family history on file.  Social History   Tobacco Use  . Smoking status: Former Smoker    Quit date: 09/09/1953    Years since quitting: 66.4  . Smokeless tobacco: Former Neurosurgeon    Types: Chew    Quit date: 09/09/1953  Substance Use Topics  . Alcohol use: Yes    Comment: every day 4 oz (bourbon) before dinner  . Drug use: No    Home Medications Prior to Admission medications   Medication Sig Start Date End Date Taking? Authorizing Provider  acetaminophen (TYLENOL) 325 MG tablet Take 2 tablets (650 mg total) by mouth every 6 (six) hours as needed for mild pain (or Fever >/= 101). 09/15/15   Alison Murray, MD  amLODipine (NORVASC) 10 MG tablet Take 1 tablet (10 mg total) by mouth daily.  09/17/15   Alison Murray, MD  aspirin EC 81 MG tablet Take 81 mg by mouth daily.    [provider]  atenolol (TENORMIN) 50 MG tablet Take 50 mg by mouth daily.  08/19/15   [provider]  atorvastatin (LIPITOR) 40 MG tablet Take 40 mg by mouth daily.  08/19/15   [provider]  AZOPT 1 % ophthalmic suspension Place 1 drop into both eyes 3 (three) times daily.  09/06/15   [provider]  diclofenac (VOLTAREN) 75 MG EC tablet Take 75 mg by mouth 2 (two) times daily.  09/06/15   [provider]  latanoprost (XALATAN) 0.005 % ophthalmic solution Place 1 drop into both eyes at bedtime.  09/06/15   [provider]  levofloxacin (LEVAQUIN) 750 MG tablet Take 1 tablet (750 mg total) by mouth daily at 12 noon. 09/17/15   Alison Murray, MD  Multiple Vitamin (MULTIVITAMIN WITH MINERALS) TABS tablet Take 1 tablet by mouth daily. Reported on 10/18/2015    [provider]  ondansetron (ZOFRAN) 4 MG tablet Take 1 tablet (4 mg total) by mouth every 6 (six) hours as needed for nausea. Patient not taking: Reported on 10/18/2015 09/15/15   Alison Murray, MD  senna-docusate (SENOKOT-S) 8.6-50 MG tablet Take 1 tablet by mouth at bedtime as needed for mild constipation. Patient not taking: Reported on 10/18/2015 09/15/15   Elisabeth Pigeon,  Wendi Maya, MD  terazosin (HYTRIN) 5 MG capsule Take 5 mg by mouth 2 (two) times daily.  09/06/15   [provider]  timolol (TIMOPTIC-XR) 0.5 % ophthalmic gel-forming Place 1 drop into both eyes at bedtime.  09/06/15   [provider]    Allergies    Penicillins  Review of Systems   Review of Systems  Constitutional: Negative for chills and fever.  HENT: Negative for congestion, sore throat and tinnitus.   Eyes: Negative for visual disturbance.  Respiratory: Negative for cough and shortness of breath.   Cardiovascular: Negative for chest pain and palpitations.  Gastrointestinal: Positive for abdominal pain. Negative for  constipation, diarrhea, nausea and vomiting.  Genitourinary: Positive for difficulty urinating. Negative for dysuria, enuresis, flank pain, frequency, hematuria and scrotal swelling.  Musculoskeletal: Negative for back pain and myalgias.  Skin: Negative for rash.  Neurological: Negative for dizziness and headaches.  Hematological: Does not bruise/bleed easily.    Physical Exam Updated Vital Signs BP 134/70   Pulse 67   Temp 99.2 F (37.3 C)   Resp 18   Ht 5\' 10"  (1.778 m)   Wt 75.8 kg   SpO2 99%   BMI 23.96 kg/m   Physical Exam Vitals and nursing note reviewed.  Constitutional:      General: He is not in acute distress.    Appearance: He is not ill-appearing.  HENT:     Head: Normocephalic and atraumatic.     Nose: No congestion.     Mouth/Throat:     Mouth: Mucous membranes are moist.     Pharynx: Oropharynx is clear.  Eyes:     General: No scleral icterus. Cardiovascular:     Rate and Rhythm: Normal rate and regular rhythm.     Pulses: Normal pulses.     Heart sounds: No murmur heard.  No friction rub. No gallop.   Pulmonary:     Effort: No respiratory distress.     Breath sounds: No wheezing, rhonchi or rales.  Abdominal:     General: There is no distension.     Palpations: Abdomen is soft. There is no mass.     Tenderness: There is no abdominal tenderness. There is no right CVA tenderness, left CVA tenderness or guarding.  Musculoskeletal:        General: No swelling, tenderness or signs of injury.     Right lower leg: Edema present.     Left lower leg: Edema present.     Comments: On exam patient had 1+ pedal edema bilaterally, he had good pedal pulses, good capillary refill, no ulcers or other lesions noted.  Skin:    General: Skin is warm and dry.     Capillary Refill: Capillary refill takes less than 2 seconds.     Findings: No rash.  Neurological:     Mental Status: He is alert and oriented to person, place, and time.  Psychiatric:        Mood and  Affect: Mood normal.     ED Results / Procedures / Treatments   Labs (all labs ordered are listed, but only abnormal results are displayed) Labs Reviewed  I-STAT CHEM 8, ED - Abnormal; Notable for the following components:      Result Value   Glucose, Bld 114 (*)    All other components within normal limits  URINALYSIS, ROUTINE W REFLEX MICROSCOPIC    EKG None  Radiology No results found.  Procedures Procedures (including critical care time)  Medications  Ordered in ED Medications - No data to display  ED Course  I have reviewed the triage vital signs and the nursing notes.  Pertinent labs & imaging results that were available during my care of the patient were reviewed by me and considered in my medical decision making (see chart for details).    MDM Rules/Calculators/A&P                          I have personally reviewed all imaging, labs and have interpreted them.  On exam patient was alert, oriented did not show any acute signs stress, vital signs reassuring.  Nursing staff performed a bladder scan showed some fluid in bladder,was cathed and drained 1100 cc and states they saw a small stone.  Patient states he is in no pain at this time and is feeling much better after being cath, abdominal exam was benign, no CVA tenderness.  UA performed did not show signs of infection.  Will get a Chem-8 to evaluate creatinine level and refer him to urology  I have low suspicion for pyelonephritis or UTI as UA does not show leukocytes or nitrates, he denies fever, chills, abdominal pain, dysuria and he was nontoxic-appearing on exam.  Low suspicion for systemic infection as he was nontoxic-appearing, vital signs reassuring, no obvious source of infection noted.  Low suspicion for DVT as patient denies leg pain, has no history of DVTs in the past, denies recent injury, states legs are always swollen.  Low suspicion for PE as he denies pleuritic chest pain, shortness of breath, does not  smoke cigarettes, no family history of PEs.  Low suspicion for CHF exacerbation as he denies shortness of breath, lung sounds were clear bilaterally, no JVD noted. due to improvement of abdominal pain after catheterization and benign physical exam, reassuring vital signs nontoxic-appearing  further lab work and imaging were not warranted at this time.  Patient appears to be resting comfortably in bed showing no acute signs distress.  Vital signs have remained stable does not meet criteria to be admitted to the hospital.  Likely patient suffered urinary retention secondary to a kidney stone which spontaneously passed.  Recommend he follows up with urologist.  Patient was discussed with attending who agrees with assessment and plan.  Patient was given at home care as well as strict return precautions.  Patient verbalized understanding agreed to plan. Final Clinical Impression(s) / ED Diagnoses Final diagnoses:  Urinary retention    Rx / DC Orders ED Discharge Orders    None       Carroll Sage, PA-C 02/20/20 1227    Derwood Kaplan, MD 02/20/20 1236

## 2020-02-20 NOTE — ED Notes (Signed)
Once catheter was inserted a stone passed through the tube. PA and RN patty made aware of sone.

## 2020-02-20 NOTE — ED Triage Notes (Signed)
Pt come in due to not being able to urinate, dribbled a little yesterday, Increased his po intake, still not able to void.   Unable to obtain reading with bladder scanner, Foley cath to be placed.   PT IS BLIND!

## 2020-02-20 NOTE — Discharge Instructions (Addendum)
You have been seen here for urinary retention, physical exam and labs look reassuring.  Encourage you to stay hydrated as this can help with any remaining stones.  You may also take over-the-counter pain medications like ibuprofen or Tylenol every 6 hours please follow dosing on the back of bottle.  I have given you contact information for a urologist I want you to call them at your earliest convenience as I feel you will need further follow-up.  I want to come back to the emergency department if you develop urinary retention, severe abdominal pain, nausea, vomiting, diarrhea, chest pain, shortness of breath, fever, chills as these symptoms require further evaluation and management.

## 2020-02-23 DIAGNOSIS — H548 Legal blindness, as defined in USA: Secondary | ICD-10-CM | POA: Diagnosis not present

## 2020-02-23 DIAGNOSIS — M1990 Unspecified osteoarthritis, unspecified site: Secondary | ICD-10-CM | POA: Diagnosis not present

## 2020-02-23 DIAGNOSIS — I10 Essential (primary) hypertension: Secondary | ICD-10-CM | POA: Diagnosis not present

## 2020-02-23 DIAGNOSIS — K219 Gastro-esophageal reflux disease without esophagitis: Secondary | ICD-10-CM | POA: Diagnosis not present

## 2020-02-23 DIAGNOSIS — H349 Unspecified retinal vascular occlusion: Secondary | ICD-10-CM | POA: Diagnosis not present

## 2020-02-23 DIAGNOSIS — E43 Unspecified severe protein-calorie malnutrition: Secondary | ICD-10-CM | POA: Diagnosis not present

## 2020-02-26 DIAGNOSIS — R351 Nocturia: Secondary | ICD-10-CM | POA: Diagnosis not present

## 2020-02-26 DIAGNOSIS — R350 Frequency of micturition: Secondary | ICD-10-CM | POA: Diagnosis not present

## 2020-02-26 DIAGNOSIS — R3914 Feeling of incomplete bladder emptying: Secondary | ICD-10-CM | POA: Diagnosis not present

## 2020-02-27 DIAGNOSIS — R300 Dysuria: Secondary | ICD-10-CM | POA: Diagnosis not present

## 2020-02-27 DIAGNOSIS — H9190 Unspecified hearing loss, unspecified ear: Secondary | ICD-10-CM | POA: Diagnosis not present

## 2020-02-27 DIAGNOSIS — D692 Other nonthrombocytopenic purpura: Secondary | ICD-10-CM | POA: Diagnosis not present

## 2020-02-27 DIAGNOSIS — M1990 Unspecified osteoarthritis, unspecified site: Secondary | ICD-10-CM | POA: Diagnosis not present

## 2020-02-27 DIAGNOSIS — E7800 Pure hypercholesterolemia, unspecified: Secondary | ICD-10-CM | POA: Diagnosis not present

## 2020-02-27 DIAGNOSIS — M5136 Other intervertebral disc degeneration, lumbar region: Secondary | ICD-10-CM | POA: Diagnosis not present

## 2020-02-27 DIAGNOSIS — H409 Unspecified glaucoma: Secondary | ICD-10-CM | POA: Diagnosis not present

## 2020-02-27 DIAGNOSIS — Z7982 Long term (current) use of aspirin: Secondary | ICD-10-CM | POA: Diagnosis not present

## 2020-02-27 DIAGNOSIS — K219 Gastro-esophageal reflux disease without esophagitis: Secondary | ICD-10-CM | POA: Diagnosis not present

## 2020-02-27 DIAGNOSIS — K5900 Constipation, unspecified: Secondary | ICD-10-CM | POA: Diagnosis not present

## 2020-02-27 DIAGNOSIS — I10 Essential (primary) hypertension: Secondary | ICD-10-CM | POA: Diagnosis not present

## 2020-02-27 DIAGNOSIS — H548 Legal blindness, as defined in USA: Secondary | ICD-10-CM | POA: Diagnosis not present

## 2020-02-27 DIAGNOSIS — Z6824 Body mass index (BMI) 24.0-24.9, adult: Secondary | ICD-10-CM | POA: Diagnosis not present

## 2020-02-27 DIAGNOSIS — E43 Unspecified severe protein-calorie malnutrition: Secondary | ICD-10-CM | POA: Diagnosis not present

## 2020-02-27 DIAGNOSIS — H349 Unspecified retinal vascular occlusion: Secondary | ICD-10-CM | POA: Diagnosis not present

## 2020-02-27 DIAGNOSIS — N401 Enlarged prostate with lower urinary tract symptoms: Secondary | ICD-10-CM | POA: Diagnosis not present

## 2020-02-27 DIAGNOSIS — M4856XD Collapsed vertebra, not elsewhere classified, lumbar region, subsequent encounter for fracture with routine healing: Secondary | ICD-10-CM | POA: Diagnosis not present

## 2020-02-27 DIAGNOSIS — Z8619 Personal history of other infectious and parasitic diseases: Secondary | ICD-10-CM | POA: Diagnosis not present

## 2020-02-27 DIAGNOSIS — E785 Hyperlipidemia, unspecified: Secondary | ICD-10-CM | POA: Diagnosis not present

## 2020-02-27 DIAGNOSIS — Z87891 Personal history of nicotine dependence: Secondary | ICD-10-CM | POA: Diagnosis not present

## 2020-03-01 DIAGNOSIS — H349 Unspecified retinal vascular occlusion: Secondary | ICD-10-CM | POA: Diagnosis not present

## 2020-03-01 DIAGNOSIS — M1990 Unspecified osteoarthritis, unspecified site: Secondary | ICD-10-CM | POA: Diagnosis not present

## 2020-03-01 DIAGNOSIS — E43 Unspecified severe protein-calorie malnutrition: Secondary | ICD-10-CM | POA: Diagnosis not present

## 2020-03-01 DIAGNOSIS — I10 Essential (primary) hypertension: Secondary | ICD-10-CM | POA: Diagnosis not present

## 2020-03-01 DIAGNOSIS — K219 Gastro-esophageal reflux disease without esophagitis: Secondary | ICD-10-CM | POA: Diagnosis not present

## 2020-03-01 DIAGNOSIS — H548 Legal blindness, as defined in USA: Secondary | ICD-10-CM | POA: Diagnosis not present

## 2020-03-03 DIAGNOSIS — M1990 Unspecified osteoarthritis, unspecified site: Secondary | ICD-10-CM | POA: Diagnosis not present

## 2020-03-03 DIAGNOSIS — E43 Unspecified severe protein-calorie malnutrition: Secondary | ICD-10-CM | POA: Diagnosis not present

## 2020-03-03 DIAGNOSIS — K219 Gastro-esophageal reflux disease without esophagitis: Secondary | ICD-10-CM | POA: Diagnosis not present

## 2020-03-03 DIAGNOSIS — H349 Unspecified retinal vascular occlusion: Secondary | ICD-10-CM | POA: Diagnosis not present

## 2020-03-03 DIAGNOSIS — H548 Legal blindness, as defined in USA: Secondary | ICD-10-CM | POA: Diagnosis not present

## 2020-03-03 DIAGNOSIS — I10 Essential (primary) hypertension: Secondary | ICD-10-CM | POA: Diagnosis not present

## 2020-03-04 DIAGNOSIS — R338 Other retention of urine: Secondary | ICD-10-CM | POA: Diagnosis not present

## 2020-03-04 DIAGNOSIS — R3914 Feeling of incomplete bladder emptying: Secondary | ICD-10-CM | POA: Diagnosis not present

## 2020-03-09 DIAGNOSIS — R339 Retention of urine, unspecified: Secondary | ICD-10-CM | POA: Diagnosis not present

## 2020-03-12 DIAGNOSIS — I10 Essential (primary) hypertension: Secondary | ICD-10-CM | POA: Diagnosis not present

## 2020-03-12 DIAGNOSIS — H548 Legal blindness, as defined in USA: Secondary | ICD-10-CM | POA: Diagnosis not present

## 2020-03-12 DIAGNOSIS — E43 Unspecified severe protein-calorie malnutrition: Secondary | ICD-10-CM | POA: Diagnosis not present

## 2020-03-12 DIAGNOSIS — H349 Unspecified retinal vascular occlusion: Secondary | ICD-10-CM | POA: Diagnosis not present

## 2020-03-12 DIAGNOSIS — K219 Gastro-esophageal reflux disease without esophagitis: Secondary | ICD-10-CM | POA: Diagnosis not present

## 2020-03-12 DIAGNOSIS — M1990 Unspecified osteoarthritis, unspecified site: Secondary | ICD-10-CM | POA: Diagnosis not present

## 2020-03-18 DIAGNOSIS — R3914 Feeling of incomplete bladder emptying: Secondary | ICD-10-CM | POA: Diagnosis not present

## 2020-03-18 DIAGNOSIS — R338 Other retention of urine: Secondary | ICD-10-CM | POA: Diagnosis not present

## 2020-03-19 DIAGNOSIS — H548 Legal blindness, as defined in USA: Secondary | ICD-10-CM | POA: Diagnosis not present

## 2020-03-19 DIAGNOSIS — H349 Unspecified retinal vascular occlusion: Secondary | ICD-10-CM | POA: Diagnosis not present

## 2020-03-19 DIAGNOSIS — I10 Essential (primary) hypertension: Secondary | ICD-10-CM | POA: Diagnosis not present

## 2020-03-19 DIAGNOSIS — K219 Gastro-esophageal reflux disease without esophagitis: Secondary | ICD-10-CM | POA: Diagnosis not present

## 2020-03-19 DIAGNOSIS — M1990 Unspecified osteoarthritis, unspecified site: Secondary | ICD-10-CM | POA: Diagnosis not present

## 2020-03-19 DIAGNOSIS — E43 Unspecified severe protein-calorie malnutrition: Secondary | ICD-10-CM | POA: Diagnosis not present

## 2020-03-26 DIAGNOSIS — K219 Gastro-esophageal reflux disease without esophagitis: Secondary | ICD-10-CM | POA: Diagnosis not present

## 2020-03-26 DIAGNOSIS — H548 Legal blindness, as defined in USA: Secondary | ICD-10-CM | POA: Diagnosis not present

## 2020-03-26 DIAGNOSIS — M1990 Unspecified osteoarthritis, unspecified site: Secondary | ICD-10-CM | POA: Diagnosis not present

## 2020-03-26 DIAGNOSIS — I10 Essential (primary) hypertension: Secondary | ICD-10-CM | POA: Diagnosis not present

## 2020-03-26 DIAGNOSIS — E43 Unspecified severe protein-calorie malnutrition: Secondary | ICD-10-CM | POA: Diagnosis not present

## 2020-03-26 DIAGNOSIS — H349 Unspecified retinal vascular occlusion: Secondary | ICD-10-CM | POA: Diagnosis not present

## 2020-03-28 DIAGNOSIS — Z87891 Personal history of nicotine dependence: Secondary | ICD-10-CM | POA: Diagnosis not present

## 2020-03-28 DIAGNOSIS — Z7982 Long term (current) use of aspirin: Secondary | ICD-10-CM | POA: Diagnosis not present

## 2020-03-28 DIAGNOSIS — N401 Enlarged prostate with lower urinary tract symptoms: Secondary | ICD-10-CM | POA: Diagnosis not present

## 2020-03-28 DIAGNOSIS — N138 Other obstructive and reflux uropathy: Secondary | ICD-10-CM | POA: Diagnosis not present

## 2020-03-28 DIAGNOSIS — E43 Unspecified severe protein-calorie malnutrition: Secondary | ICD-10-CM | POA: Diagnosis not present

## 2020-03-28 DIAGNOSIS — M5136 Other intervertebral disc degeneration, lumbar region: Secondary | ICD-10-CM | POA: Diagnosis not present

## 2020-03-28 DIAGNOSIS — R300 Dysuria: Secondary | ICD-10-CM | POA: Diagnosis not present

## 2020-03-28 DIAGNOSIS — Z8619 Personal history of other infectious and parasitic diseases: Secondary | ICD-10-CM | POA: Diagnosis not present

## 2020-03-28 DIAGNOSIS — H349 Unspecified retinal vascular occlusion: Secondary | ICD-10-CM | POA: Diagnosis not present

## 2020-03-28 DIAGNOSIS — E7800 Pure hypercholesterolemia, unspecified: Secondary | ICD-10-CM | POA: Diagnosis not present

## 2020-03-28 DIAGNOSIS — I10 Essential (primary) hypertension: Secondary | ICD-10-CM | POA: Diagnosis not present

## 2020-03-28 DIAGNOSIS — E669 Obesity, unspecified: Secondary | ICD-10-CM | POA: Diagnosis not present

## 2020-03-28 DIAGNOSIS — E785 Hyperlipidemia, unspecified: Secondary | ICD-10-CM | POA: Diagnosis not present

## 2020-03-28 DIAGNOSIS — Z466 Encounter for fitting and adjustment of urinary device: Secondary | ICD-10-CM | POA: Diagnosis not present

## 2020-03-28 DIAGNOSIS — H9190 Unspecified hearing loss, unspecified ear: Secondary | ICD-10-CM | POA: Diagnosis not present

## 2020-03-28 DIAGNOSIS — H548 Legal blindness, as defined in USA: Secondary | ICD-10-CM | POA: Diagnosis not present

## 2020-03-28 DIAGNOSIS — E46 Unspecified protein-calorie malnutrition: Secondary | ICD-10-CM | POA: Diagnosis not present

## 2020-03-28 DIAGNOSIS — M1990 Unspecified osteoarthritis, unspecified site: Secondary | ICD-10-CM | POA: Diagnosis not present

## 2020-03-28 DIAGNOSIS — K219 Gastro-esophageal reflux disease without esophagitis: Secondary | ICD-10-CM | POA: Diagnosis not present

## 2020-03-28 DIAGNOSIS — M4856XD Collapsed vertebra, not elsewhere classified, lumbar region, subsequent encounter for fracture with routine healing: Secondary | ICD-10-CM | POA: Diagnosis not present

## 2020-03-28 DIAGNOSIS — D692 Other nonthrombocytopenic purpura: Secondary | ICD-10-CM | POA: Diagnosis not present

## 2020-03-28 DIAGNOSIS — K5900 Constipation, unspecified: Secondary | ICD-10-CM | POA: Diagnosis not present

## 2020-03-28 DIAGNOSIS — Z6824 Body mass index (BMI) 24.0-24.9, adult: Secondary | ICD-10-CM | POA: Diagnosis not present

## 2020-03-28 DIAGNOSIS — H409 Unspecified glaucoma: Secondary | ICD-10-CM | POA: Diagnosis not present

## 2020-03-30 DIAGNOSIS — E46 Unspecified protein-calorie malnutrition: Secondary | ICD-10-CM | POA: Diagnosis not present

## 2020-03-30 DIAGNOSIS — H349 Unspecified retinal vascular occlusion: Secondary | ICD-10-CM | POA: Diagnosis not present

## 2020-03-30 DIAGNOSIS — K219 Gastro-esophageal reflux disease without esophagitis: Secondary | ICD-10-CM | POA: Diagnosis not present

## 2020-03-30 DIAGNOSIS — M1990 Unspecified osteoarthritis, unspecified site: Secondary | ICD-10-CM | POA: Diagnosis not present

## 2020-03-30 DIAGNOSIS — H548 Legal blindness, as defined in USA: Secondary | ICD-10-CM | POA: Diagnosis not present

## 2020-03-30 DIAGNOSIS — I10 Essential (primary) hypertension: Secondary | ICD-10-CM | POA: Diagnosis not present

## 2020-03-31 DIAGNOSIS — E46 Unspecified protein-calorie malnutrition: Secondary | ICD-10-CM | POA: Diagnosis not present

## 2020-03-31 DIAGNOSIS — I10 Essential (primary) hypertension: Secondary | ICD-10-CM | POA: Diagnosis not present

## 2020-03-31 DIAGNOSIS — K219 Gastro-esophageal reflux disease without esophagitis: Secondary | ICD-10-CM | POA: Diagnosis not present

## 2020-03-31 DIAGNOSIS — M1990 Unspecified osteoarthritis, unspecified site: Secondary | ICD-10-CM | POA: Diagnosis not present

## 2020-03-31 DIAGNOSIS — H548 Legal blindness, as defined in USA: Secondary | ICD-10-CM | POA: Diagnosis not present

## 2020-03-31 DIAGNOSIS — H349 Unspecified retinal vascular occlusion: Secondary | ICD-10-CM | POA: Diagnosis not present

## 2020-04-01 DIAGNOSIS — R338 Other retention of urine: Secondary | ICD-10-CM | POA: Diagnosis not present

## 2020-04-06 DIAGNOSIS — K219 Gastro-esophageal reflux disease without esophagitis: Secondary | ICD-10-CM | POA: Diagnosis not present

## 2020-04-06 DIAGNOSIS — M1990 Unspecified osteoarthritis, unspecified site: Secondary | ICD-10-CM | POA: Diagnosis not present

## 2020-04-06 DIAGNOSIS — H548 Legal blindness, as defined in USA: Secondary | ICD-10-CM | POA: Diagnosis not present

## 2020-04-06 DIAGNOSIS — H349 Unspecified retinal vascular occlusion: Secondary | ICD-10-CM | POA: Diagnosis not present

## 2020-04-06 DIAGNOSIS — I10 Essential (primary) hypertension: Secondary | ICD-10-CM | POA: Diagnosis not present

## 2020-04-06 DIAGNOSIS — E46 Unspecified protein-calorie malnutrition: Secondary | ICD-10-CM | POA: Diagnosis not present

## 2020-04-07 ENCOUNTER — Telehealth: Payer: Self-pay

## 2020-04-07 DIAGNOSIS — K5900 Constipation, unspecified: Secondary | ICD-10-CM | POA: Diagnosis not present

## 2020-04-07 DIAGNOSIS — R159 Full incontinence of feces: Secondary | ICD-10-CM | POA: Diagnosis not present

## 2020-04-07 DIAGNOSIS — R630 Anorexia: Secondary | ICD-10-CM | POA: Diagnosis not present

## 2020-04-07 DIAGNOSIS — R634 Abnormal weight loss: Secondary | ICD-10-CM | POA: Diagnosis not present

## 2020-04-07 NOTE — Telephone Encounter (Signed)
(  3:16p) Telephone call to patient to schedule palliative care visit with patient.SW received a voicemail and left a message for Alexander Diaz to return call to discuss palliative care services and schedule a visit.

## 2020-04-08 ENCOUNTER — Other Ambulatory Visit: Payer: Self-pay | Admitting: Physician Assistant

## 2020-04-08 DIAGNOSIS — H3561 Retinal hemorrhage, right eye: Secondary | ICD-10-CM | POA: Diagnosis not present

## 2020-04-08 DIAGNOSIS — H34831 Tributary (branch) retinal vein occlusion, right eye, with macular edema: Secondary | ICD-10-CM | POA: Diagnosis not present

## 2020-04-08 DIAGNOSIS — H3582 Retinal ischemia: Secondary | ICD-10-CM | POA: Diagnosis not present

## 2020-04-08 DIAGNOSIS — H43811 Vitreous degeneration, right eye: Secondary | ICD-10-CM | POA: Diagnosis not present

## 2020-04-08 DIAGNOSIS — R634 Abnormal weight loss: Secondary | ICD-10-CM

## 2020-04-09 DIAGNOSIS — K219 Gastro-esophageal reflux disease without esophagitis: Secondary | ICD-10-CM | POA: Diagnosis not present

## 2020-04-09 DIAGNOSIS — I10 Essential (primary) hypertension: Secondary | ICD-10-CM | POA: Diagnosis not present

## 2020-04-09 DIAGNOSIS — E46 Unspecified protein-calorie malnutrition: Secondary | ICD-10-CM | POA: Diagnosis not present

## 2020-04-09 DIAGNOSIS — H548 Legal blindness, as defined in USA: Secondary | ICD-10-CM | POA: Diagnosis not present

## 2020-04-09 DIAGNOSIS — H349 Unspecified retinal vascular occlusion: Secondary | ICD-10-CM | POA: Diagnosis not present

## 2020-04-09 DIAGNOSIS — M1990 Unspecified osteoarthritis, unspecified site: Secondary | ICD-10-CM | POA: Diagnosis not present

## 2020-04-13 ENCOUNTER — Other Ambulatory Visit: Payer: Self-pay

## 2020-04-13 ENCOUNTER — Other Ambulatory Visit: Payer: Medicare Other | Admitting: *Deleted

## 2020-04-13 ENCOUNTER — Other Ambulatory Visit: Payer: Medicare Other

## 2020-04-13 DIAGNOSIS — E46 Unspecified protein-calorie malnutrition: Secondary | ICD-10-CM | POA: Diagnosis not present

## 2020-04-13 DIAGNOSIS — Z515 Encounter for palliative care: Secondary | ICD-10-CM

## 2020-04-13 DIAGNOSIS — H548 Legal blindness, as defined in USA: Secondary | ICD-10-CM | POA: Diagnosis not present

## 2020-04-13 DIAGNOSIS — K219 Gastro-esophageal reflux disease without esophagitis: Secondary | ICD-10-CM | POA: Diagnosis not present

## 2020-04-13 DIAGNOSIS — M1990 Unspecified osteoarthritis, unspecified site: Secondary | ICD-10-CM | POA: Diagnosis not present

## 2020-04-13 DIAGNOSIS — I10 Essential (primary) hypertension: Secondary | ICD-10-CM | POA: Diagnosis not present

## 2020-04-13 DIAGNOSIS — H349 Unspecified retinal vascular occlusion: Secondary | ICD-10-CM | POA: Diagnosis not present

## 2020-04-13 NOTE — Progress Notes (Signed)
COMMUNITY PALLIATIVE CARE SW NOTE  PATIENT NAME: Alexander Diaz DOB: November 14, 1928 MRN: [id number]  PRIMARY CARE PROVIDER: Gaspar Garbe, MD  RESPONSIBLE PARTY:  Acct ID - Guarantor Home Phone Work Phone Relationship Acct Type  [id number] Faylene Kurtz [phone]  Self P/F     [address], [place], [place] [address]     PLAN OF CARE and INTERVENTIONS:             1. GOALS OF CARE/ ADVANCE CARE PLANNING:  Goal is for patient to remain his home with his wife. Patient is a FULL CODE.  SOCIAL/EMOTIONAL/SPIRITUAL ASSESSMENT/ INTERVENTIONS:  SW and RN-M. Dimas Aguas completed a face-to-face visit with patient at home. He was laying on the couch sleeping and did not arouse during this visit. The team talked with his daughter, who provided patient's medical status and social history. The team provided education regarding the palliative care program, services and visit frequency. [name] provided verbal consent to services. Patient started declining in December 2019. He started having falls, spinostinosis and blindness. He receives a shot in his right eye. 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 Recent hospice consult and family declined as patient will continue to have procedures done to improve his overall status. CT scan by gastronologist is scheduled forTuesday. Patient not eating, no sense of taste or smell. Patient was not tested for COVID, but is vaccinated. Patient has lost 80lbs. He is also having swallowing issues and medication prescribed in the past week-no difference noted.  Patient was taking Boost 3 weeks ago, and then stopped. Patient has catheterized since August. Next appointment May 13, 2020 to further evaluate and give patient some options. He has had two rounds of ABT due to UTI's. Patient has strong spirit and faith background.  He is alert and oriented x3. He wears hearing aids. Patient has generalized pain. Daughter feels the patient is having  numbness to his feet. He walks with a walker, unsteady gait. Patient has a private caregiver-Maddie, Monday-Thursday 9 to 5 and [employer]fort Ephraim Hamburger will come on Fridays 10am-1pm.  His current weight is 150 lbs. [facility] provide physical therapy 2x/week (started 9/14) and he shown progress according to daughter. He is incontinent of bowel. He spends the day on the couch, because he is comfortable. Increased sleeping overall. Daughter concern that he has not had a real bath. Nurse provided education for safety in the shower.  Patient was born in [place]. He graduated high school and served in [employer] during the  [place] War/. Patient is the youngest of 10 children. He has been married 70 years to wife. They have 4 children, and their  two older sons are deceased. Patient worked as a Scientist, research (physical sciences). Daughters are his power of attorney. The team provided education regarding the MOST form and left forms in the home for ongoing conversation.   PATIENT/CAREGIVER EDUCATION/ COPING: Patient was sleeping during this visit. His daughter was tearful, but appears to coping adequately.  2. PERSONAL EMERGENCY PLAN:  911 can be activated for emergencies.  3. COMMUNITY RESOURCES COORDINATION/ HEALTH CARE NAVIGATION:  Patient is receiving therapy through [facility]. Patient has a hired caregiver.  4. FINANCIAL/LEGAL CONCERNS/INTERVENTIONS:  None.     SOCIAL HX:  Social History   Tobacco Use  . Smoking status: Former Smoker    Quit date: 09/09/1953    Years since quitting: 66.6  . Smokeless tobacco: Former Neurosurgeon    Types: Sports administrator  Quit date: 09/09/1953  Substance Use Topics  . Alcohol use: Yes    Comment: every day 4 oz (bourbon) before dinner    CODE STATUS: FULL CODE ADVANCED DIRECTIVES: No MOST FORM COMPLETE:  Reviewed HOSPICE EDUCATION PROVIDED: No  PPS: Patient was sleeping during this visit. His daughter report that he walks with a walker, he is blind, is hard of hearing and requires  assistance with ADL's.   Duration of visit and documentation: 60 mins.       [address] [place], [place]

## 2020-04-14 ENCOUNTER — Other Ambulatory Visit: Payer: Self-pay

## 2020-04-14 DIAGNOSIS — K219 Gastro-esophageal reflux disease without esophagitis: Secondary | ICD-10-CM | POA: Diagnosis not present

## 2020-04-14 DIAGNOSIS — H548 Legal blindness, as defined in USA: Secondary | ICD-10-CM | POA: Diagnosis not present

## 2020-04-14 DIAGNOSIS — E46 Unspecified protein-calorie malnutrition: Secondary | ICD-10-CM | POA: Diagnosis not present

## 2020-04-14 DIAGNOSIS — M1990 Unspecified osteoarthritis, unspecified site: Secondary | ICD-10-CM | POA: Diagnosis not present

## 2020-04-14 DIAGNOSIS — I10 Essential (primary) hypertension: Secondary | ICD-10-CM | POA: Diagnosis not present

## 2020-04-14 DIAGNOSIS — H349 Unspecified retinal vascular occlusion: Secondary | ICD-10-CM | POA: Diagnosis not present

## 2020-04-14 NOTE — Progress Notes (Signed)
COMMUNITY PALLIATIVE CARE RN NOTE  PATIENT NAME: Alexander Diaz DOB: 12-08-1928 MRN: [id number]  PRIMARY CARE PROVIDER: Haywood Pao, MD  RESPONSIBLE PARTY:  Acct ID - Guarantor Home Phone Work Phone Relationship Acct Type  [id number] Nelta Numbers [phone]  Self P/F     [address], [place], [place] [address]   Covid-19 Pre-screening Negative  PLAN OF CARE and INTERVENTION:  1. ADVANCE CARE PLANNING/GOALS OF CARE: Goal is for patient to remain at home with his wife. He is a Full code. 2. PATIENT/CAREGIVER EDUCATION: Explained Palliative care services, symptom management, safe mobility, s/s of infection 3. DISEASE STATUS: Joint visit made with Palliative care SW, M. Lonon. Health history provided by patient's daughter in their sitting room. She spoke of patient's issues with severe spinal stenosis and incontinence. He was recently evaluated for hospice care, and was deemed eligible by hospice physician, however patient wanted to pursue possible procedures that could possibly help him with his incontinence. Also family wanted patient's standard Medicare to remain in effect for this procedures. He has an upcoming appointment on 10/5 at [place] for a CT scan of his chest, abdomen and pelvis ordered by [facility]. On 05/13/20 patient will go back to see Dr. McDermot to review possible options for incontinence based on scan results. Daughter says that he has had about 3 failed voiding trials so his foley catheter must remain. Prior to this visit, patient just completed an hour session with PT through [place]. Afterwards, patient fell asleep lying down on his couch, which is where he spends the majority of his day. She says that he does have pain in his back, but has seemed to lessen recently. He usually rates his pain around a 2 or 3. He is ambulatory using his walker. He has numbness in his feet. 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 They were hoping this would keep the vision he did have intact for a while, but was not effective. So he requires guidance during ambulation. He also requires 1 person assistance with all ADLs. She has a caregiver named Lorna Dibble, who is with patient Mondays through Thursdays from 1030a to 530p. His daughter's are present during the weekend. They met with [facility] recently and they are starting with them coming on Fridays from 10a-1p.This Friday will be their first visit. One of her biggest concerns is that patient hardly eats and no longer has a sense of taste or smell. This started back in May of this year. He continues to lose weight and is very thin/frail. Daughter says he has lost about 80 lbs over the past year. He has told his daughter that after chewing his food, it is difficult for him to swallow, but denies feelings of any type of occlusions. They have tried giving him Boost, but he started to refusing to drink them starting about 3 weeks ago. Discussed goals of care with daughter. Her and her sister are his Durable POAs. Reviewed MOST from with daughter and left copies in the home to review with her sister. She knows that patient wants CPR attempted but does not want to be on a ventilator or want a feeding tube. Will discuss this further on next visit. Patient remained asleep on the couch after discussion with daughter. No physical indicators of pain noted. She requests that we allow patient to sleep at this time. She is agreeable to future visits with Palliative  care. Will continue to monitor.   HISTORY OF PRESENT ILLNESS: This is a 86 yo male with a history of HRN, otitis media, discitis of lumbar region, acute kidney injury and hypokalemia. Palliative care has been asked to follow patient for additional support. Will visit patient monthly and PRN.  CODE STATUS: Full Code ADVANCED DIRECTIVES: Y/POA MOST FORM: no PPS: 40%   (Duration of visit and  documentation 90 minutes)   Daryl Eastern, RN BSN

## 2020-04-15 DIAGNOSIS — E46 Unspecified protein-calorie malnutrition: Secondary | ICD-10-CM | POA: Diagnosis not present

## 2020-04-15 DIAGNOSIS — M1990 Unspecified osteoarthritis, unspecified site: Secondary | ICD-10-CM | POA: Diagnosis not present

## 2020-04-15 DIAGNOSIS — H548 Legal blindness, as defined in USA: Secondary | ICD-10-CM | POA: Diagnosis not present

## 2020-04-15 DIAGNOSIS — I10 Essential (primary) hypertension: Secondary | ICD-10-CM | POA: Diagnosis not present

## 2020-04-15 DIAGNOSIS — K219 Gastro-esophageal reflux disease without esophagitis: Secondary | ICD-10-CM | POA: Diagnosis not present

## 2020-04-15 DIAGNOSIS — H349 Unspecified retinal vascular occlusion: Secondary | ICD-10-CM | POA: Diagnosis not present

## 2020-04-16 DIAGNOSIS — E46 Unspecified protein-calorie malnutrition: Secondary | ICD-10-CM | POA: Diagnosis not present

## 2020-04-16 DIAGNOSIS — M1990 Unspecified osteoarthritis, unspecified site: Secondary | ICD-10-CM | POA: Diagnosis not present

## 2020-04-16 DIAGNOSIS — H349 Unspecified retinal vascular occlusion: Secondary | ICD-10-CM | POA: Diagnosis not present

## 2020-04-16 DIAGNOSIS — K219 Gastro-esophageal reflux disease without esophagitis: Secondary | ICD-10-CM | POA: Diagnosis not present

## 2020-04-16 DIAGNOSIS — H548 Legal blindness, as defined in USA: Secondary | ICD-10-CM | POA: Diagnosis not present

## 2020-04-16 DIAGNOSIS — I10 Essential (primary) hypertension: Secondary | ICD-10-CM | POA: Diagnosis not present

## 2020-04-19 DIAGNOSIS — I10 Essential (primary) hypertension: Secondary | ICD-10-CM | POA: Diagnosis not present

## 2020-04-19 DIAGNOSIS — K219 Gastro-esophageal reflux disease without esophagitis: Secondary | ICD-10-CM | POA: Diagnosis not present

## 2020-04-19 DIAGNOSIS — H548 Legal blindness, as defined in USA: Secondary | ICD-10-CM | POA: Diagnosis not present

## 2020-04-19 DIAGNOSIS — M1990 Unspecified osteoarthritis, unspecified site: Secondary | ICD-10-CM | POA: Diagnosis not present

## 2020-04-19 DIAGNOSIS — H349 Unspecified retinal vascular occlusion: Secondary | ICD-10-CM | POA: Diagnosis not present

## 2020-04-19 DIAGNOSIS — E46 Unspecified protein-calorie malnutrition: Secondary | ICD-10-CM | POA: Diagnosis not present

## 2020-04-20 ENCOUNTER — Other Ambulatory Visit: Payer: Self-pay

## 2020-04-20 ENCOUNTER — Ambulatory Visit
Admission: RE | Admit: 2020-04-20 | Discharge: 2020-04-20 | Disposition: A | Discharge status: Home / Self Care | Payer: Medicare Other | Source: Ambulatory Visit | Attending: Physician Assistant | Admitting: Physician Assistant

## 2020-04-20 DIAGNOSIS — R634 Abnormal weight loss: Secondary | ICD-10-CM

## 2020-04-20 DIAGNOSIS — R918 Other nonspecific abnormal finding of lung field: Secondary | ICD-10-CM | POA: Diagnosis not present

## 2020-04-20 DIAGNOSIS — K5730 Diverticulosis of large intestine without perforation or abscess without bleeding: Secondary | ICD-10-CM | POA: Diagnosis not present

## 2020-04-20 MED ORDER — IOPAMIDOL (ISOVUE-300) INJECTION 61%
100.0000 mL | Freq: Once | INTRAVENOUS | Status: AC | PRN
  Administered 2020-04-20: 100 mL via INTRAVENOUS

## 2020-04-23 DIAGNOSIS — I10 Essential (primary) hypertension: Secondary | ICD-10-CM | POA: Diagnosis not present

## 2020-04-23 DIAGNOSIS — E46 Unspecified protein-calorie malnutrition: Secondary | ICD-10-CM | POA: Diagnosis not present

## 2020-04-23 DIAGNOSIS — H349 Unspecified retinal vascular occlusion: Secondary | ICD-10-CM | POA: Diagnosis not present

## 2020-04-23 DIAGNOSIS — H548 Legal blindness, as defined in USA: Secondary | ICD-10-CM | POA: Diagnosis not present

## 2020-04-23 DIAGNOSIS — K219 Gastro-esophageal reflux disease without esophagitis: Secondary | ICD-10-CM | POA: Diagnosis not present

## 2020-04-23 DIAGNOSIS — M1990 Unspecified osteoarthritis, unspecified site: Secondary | ICD-10-CM | POA: Diagnosis not present

## 2020-04-26 ENCOUNTER — Other Ambulatory Visit: Payer: Medicare Other | Admitting: *Deleted

## 2020-04-26 ENCOUNTER — Other Ambulatory Visit: Payer: Self-pay

## 2020-04-26 DIAGNOSIS — Z515 Encounter for palliative care: Secondary | ICD-10-CM

## 2020-04-27 ENCOUNTER — Telehealth: Payer: Self-pay | Admitting: *Deleted

## 2020-04-27 DIAGNOSIS — M1990 Unspecified osteoarthritis, unspecified site: Secondary | ICD-10-CM | POA: Diagnosis not present

## 2020-04-27 DIAGNOSIS — K5900 Constipation, unspecified: Secondary | ICD-10-CM | POA: Diagnosis not present

## 2020-04-27 DIAGNOSIS — K219 Gastro-esophageal reflux disease without esophagitis: Secondary | ICD-10-CM | POA: Diagnosis not present

## 2020-04-27 DIAGNOSIS — R300 Dysuria: Secondary | ICD-10-CM | POA: Diagnosis not present

## 2020-04-27 DIAGNOSIS — Z466 Encounter for fitting and adjustment of urinary device: Secondary | ICD-10-CM | POA: Diagnosis not present

## 2020-04-27 DIAGNOSIS — E669 Obesity, unspecified: Secondary | ICD-10-CM | POA: Diagnosis not present

## 2020-04-27 DIAGNOSIS — N138 Other obstructive and reflux uropathy: Secondary | ICD-10-CM | POA: Diagnosis not present

## 2020-04-27 DIAGNOSIS — I10 Essential (primary) hypertension: Secondary | ICD-10-CM | POA: Diagnosis not present

## 2020-04-27 DIAGNOSIS — Z87891 Personal history of nicotine dependence: Secondary | ICD-10-CM | POA: Diagnosis not present

## 2020-04-27 DIAGNOSIS — H409 Unspecified glaucoma: Secondary | ICD-10-CM | POA: Diagnosis not present

## 2020-04-27 DIAGNOSIS — M5136 Other intervertebral disc degeneration, lumbar region: Secondary | ICD-10-CM | POA: Diagnosis not present

## 2020-04-27 DIAGNOSIS — Z8619 Personal history of other infectious and parasitic diseases: Secondary | ICD-10-CM | POA: Diagnosis not present

## 2020-04-27 DIAGNOSIS — M4856XD Collapsed vertebra, not elsewhere classified, lumbar region, subsequent encounter for fracture with routine healing: Secondary | ICD-10-CM | POA: Diagnosis not present

## 2020-04-27 DIAGNOSIS — Z7982 Long term (current) use of aspirin: Secondary | ICD-10-CM | POA: Diagnosis not present

## 2020-04-27 DIAGNOSIS — H548 Legal blindness, as defined in USA: Secondary | ICD-10-CM | POA: Diagnosis not present

## 2020-04-27 DIAGNOSIS — E7800 Pure hypercholesterolemia, unspecified: Secondary | ICD-10-CM | POA: Diagnosis not present

## 2020-04-27 DIAGNOSIS — D692 Other nonthrombocytopenic purpura: Secondary | ICD-10-CM | POA: Diagnosis not present

## 2020-04-27 DIAGNOSIS — H349 Unspecified retinal vascular occlusion: Secondary | ICD-10-CM | POA: Diagnosis not present

## 2020-04-27 DIAGNOSIS — N401 Enlarged prostate with lower urinary tract symptoms: Secondary | ICD-10-CM | POA: Diagnosis not present

## 2020-04-27 DIAGNOSIS — E46 Unspecified protein-calorie malnutrition: Secondary | ICD-10-CM | POA: Diagnosis not present

## 2020-04-27 DIAGNOSIS — E785 Hyperlipidemia, unspecified: Secondary | ICD-10-CM | POA: Diagnosis not present

## 2020-04-27 DIAGNOSIS — Z6824 Body mass index (BMI) 24.0-24.9, adult: Secondary | ICD-10-CM | POA: Diagnosis not present

## 2020-04-27 NOTE — Telephone Encounter (Signed)
10:24a Received a voicemail from patient's granddaughter Marchelle Folks stating that several hours after my visit yesterday, patient started feeling sick again. He was too weak to make it to his Urology appointment today in person so they did a virtual visit. 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 The family has chosen comfort care only and asked me to reach out to Dr. Deneen Harts office for a hospice referral. I did contact the doctor's office and received a call back from his office at 5:30p today giving me the verbal order requested. I will send this information over to [facility]'s hospice referral center, who will then reach out to family to schedule the admission date/time.

## 2020-05-11 NOTE — Progress Notes (Signed)
COMMUNITY PALLIATIVE CARE RN NOTE  PATIENT NAME: Alexander Diaz DOB: [birth year] MRN: [id number]  PRIMARY CARE PROVIDER: Gaspar Garbe, MD  RESPONSIBLE PARTY:  Acct ID - Guarantor Home Phone Work Phone Relationship Acct Type  [id number] Faylene Kurtz [phone]  Self P/F     [address], [place], [place] [address]   Covid-19 Pre-screening Negative  PLAN OF CARE and INTERVENTION:  1. ADVANCE CARE PLANNING/GOALS OF CARE: Goal is for patient to remain at home with his wife. He has a DNR. 2. PATIENT/CAREGIVER EDUCATION: Symptom management, safe mobility/transfers, s/s of infection 3. DISEASE STATUS: Received a message from [facility]'s Triage nurse stating that patient's daughter called and says that patient is declining and they wanted to discuss hospice and the next steps. Prior to visit, I spoke with his daughter [name] who states that she received the results of patient's CT scan and it showed a mass by his prostate and bladder which they suspect as being cancerous. They also found some lesions with possible mets per daughter. It also showed a large amount of stool in his colon and he hasn't had a BM in several days. He is not wanting to eat or drink. He was experiencing severe nausea and dry heaves over the weekend. They called his PCPs office and Zofran was prescribed, which has helped. He is very weak and having difficulty with ambulation with his walker even with assistance. Daughter also feels that he may have a UTI as his urine is very dark with a foul odor. He has a foley catheter.  He has been sleeping most of the day while lying on the couch. Upon my arrival, patient is lying on the couch asleep. He is aroused with verbal and tactile stimulation. Due to his severe constipation, he was agreeable to allow me to perform a rectal check. He was able to stand with assistance and ambulate about 25 ft into his bedroom with his walker and stand-by assistance. There was a large amount of hard  stool noted in his rectal vault. Disimpacted patient and he was able to have a very large BM. He reports feeling much better. He has a Urology appointment tomorrow, and says that he is interested in seeing what the doctor says or if he has any treatments to offer regarding the new mass that was found prior to considering a hospice consult, if he is strong enough to make it there. Daughters are concerned that they will be unable to get him there d/t progressive weakness but they will see what his condition is like in the morning.  He says that he is ready to eat something so family fixed him some lunch. Advised daughter to let me know what the Urologist says tomorrow. She agreed. Will continue to monitor.   HISTORY OF PRESENT ILLNESS: This is a 86 yo male with a history of HRN, otitis media, discitis of lumbar region, acute kidney injury and hypokalemia. Palliative care team continues to follow patient. Will visit monthly and PRN.   CODE STATUS: Full code ADVANCED DIRECTIVES: Y MOST FORM: no PPS: weak 40%   PHYSICAL EXAM:   LUNGS: clear to auscultation  CARDIAC: Cor RRR EXTREMITIES: No edema SKIN: Exposed skin is dry and intact  NEURO: Alert and oriented x 3, legally blind, increased generalized weakness, ambulatory w/walker   (Duration of visit and documentation 90 minutes)   Candiss Norse, RN BSN

## 2020-05-13 ENCOUNTER — Emergency Department (HOSPITAL_COMMUNITY)
Admission: EM | Admit: 2020-05-13 | Discharge: 2020-05-14 | Disposition: A | Discharge status: Home / Self Care | Payer: Medicare Other | Attending: Emergency Medicine | Admitting: Emergency Medicine

## 2020-05-13 ENCOUNTER — Other Ambulatory Visit: Payer: Self-pay

## 2020-05-13 DIAGNOSIS — Z79899 Other long term (current) drug therapy: Secondary | ICD-10-CM | POA: Insufficient documentation

## 2020-05-13 DIAGNOSIS — R339 Retention of urine, unspecified: Secondary | ICD-10-CM | POA: Insufficient documentation

## 2020-05-13 DIAGNOSIS — T83198A Other mechanical complication of other urinary devices and implants, initial encounter: Secondary | ICD-10-CM | POA: Diagnosis not present

## 2020-05-13 DIAGNOSIS — Z87891 Personal history of nicotine dependence: Secondary | ICD-10-CM | POA: Diagnosis not present

## 2020-05-13 DIAGNOSIS — R310 Gross hematuria: Secondary | ICD-10-CM | POA: Diagnosis not present

## 2020-05-13 DIAGNOSIS — T83098A Other mechanical complication of other indwelling urethral catheter, initial encounter: Secondary | ICD-10-CM | POA: Diagnosis not present

## 2020-05-13 DIAGNOSIS — T83091A Other mechanical complication of indwelling urethral catheter, initial encounter: Secondary | ICD-10-CM

## 2020-05-13 DIAGNOSIS — I10 Essential (primary) hypertension: Secondary | ICD-10-CM | POA: Diagnosis not present

## 2020-05-13 DIAGNOSIS — R5381 Other malaise: Secondary | ICD-10-CM | POA: Diagnosis not present

## 2020-05-13 MED ORDER — ONDANSETRON HCL 4 MG/2ML IJ SOLN
4.0000 mg | Freq: Once | INTRAMUSCULAR | Status: AC
  Administered 2020-05-13: 4 mg via INTRAVENOUS
  Filled 2020-05-13: qty 2

## 2020-05-13 MED ORDER — FENTANYL CITRATE (PF) 100 MCG/2ML IJ SOLN
25.0000 ug | Freq: Once | INTRAMUSCULAR | Status: AC
  Administered 2020-05-13: 25 ug via INTRAVENOUS
  Filled 2020-05-13: qty 2

## 2020-05-13 MED ORDER — ONDANSETRON 4 MG PO TBDP
4.0000 mg | ORAL_TABLET | Freq: Once | ORAL | Status: AC
  Administered 2020-05-13: 4 mg via ORAL
  Filled 2020-05-13: qty 1

## 2020-05-13 MED ORDER — FENTANYL CITRATE (PF) 100 MCG/2ML IJ SOLN: 50.0000 ug | Freq: Once | INTRAMUSCULAR | Status: DC

## 2020-05-13 NOTE — ED Provider Notes (Signed)
  Face-to-face evaluation   History: Patient presents for evaluation of Foley catheter which is not working.  Physical exam: Elderly male who is alert and complaining of right lower abdominal pain.  He has a large suprapubic mass consistent with distended bladder.  Medical screening examination/treatment/procedure(s) were conducted as a shared visit with non-physician practitioner(s) and myself.  I personally evaluated the patient during the encounter    Mancel Bale, MD 05/15/20 [phone]

## 2020-05-13 NOTE — ED Provider Notes (Signed)
[name] Provider Note   CSN: [id number] Arrival date & time: 05/13/20  2116     History Chief Complaint  Patient presents with  . Urinary Retention    foley blocked    Alexander Diaz is a 86 y.o. male  Presenting from home with obstructed Foley catheter that began today.  Patient's daughter is at bedside, states home health nurse noticed blood in Foley bag today, irrigated a few times and changed out the catheter, however could not get it to continue to drain properly therefore brought him in for evaluation.  He is complaining of some lower abdominal pain radiating to his back.  He has nausea with dry heaving though this appears to be ongoing for him per chart review.  Patient has recent diagnosis of prostate and bladder mass concerning for cancer - he is on on palliative care at home.  Patient's daughter states the bleeding has been coming and going recently.  Per chart review, it appears he follows with Dr. Mena Goes  The history is provided by the patient and a relative.       Past Medical History:  Diagnosis Date  . Renal disorder     Patient Active Problem List   Diagnosis Date Noted  . AKI (acute kidney injury) (HCC) 09/16/2015  . Discitis of lumbar region 09/12/2015  . HTN (hypertension) 09/11/2015  . Hypokalemia 09/11/2015  . Pneumococcal bacteremia 09/11/2015  . Otitis media     No past surgical history on file.     No family history on file.  Social History   Tobacco Use  . Smoking status: Former Smoker    Quit date: 09/09/1953    Years since quitting: 66.7  . Smokeless tobacco: Former Neurosurgeon    Types: Chew    Quit date: 09/09/1953  Substance Use Topics  . Alcohol use: Yes    Comment: every day 4 oz (bourbon) before dinner  . Drug use: No    Home Medications Prior to Admission medications   Medication Sig Start Date End Date Taking? Authorizing Provider  acetaminophen (TYLENOL) 325 MG tablet Take 2 tablets (650 mg  total) by mouth every 6 (six) hours as needed for mild pain (or Fever >/= 101). 09/15/15   Alison Murray, MD  amLODipine (NORVASC) 10 MG tablet Take 1 tablet (10 mg total) by mouth daily. 09/17/15   Alison Murray, MD  aspirin EC 81 MG tablet Take 81 mg by mouth daily.    [provider]  atenolol (TENORMIN) 50 MG tablet Take 50 mg by mouth daily.  08/19/15   [provider]  atorvastatin (LIPITOR) 40 MG tablet Take 40 mg by mouth daily.  08/19/15   [provider]  AZOPT 1 % ophthalmic suspension Place 1 drop into both eyes 3 (three) times daily.  09/06/15   [provider]  diclofenac (VOLTAREN) 75 MG EC tablet Take 75 mg by mouth 2 (two) times daily.  09/06/15   [provider]  latanoprost (XALATAN) 0.005 % ophthalmic solution Place 1 drop into both eyes at bedtime.  09/06/15   [provider]  levofloxacin (LEVAQUIN) 750 MG tablet Take 1 tablet (750 mg total) by mouth daily at 12 noon. 09/17/15   Alison Murray, MD  Multiple Vitamin (MULTIVITAMIN WITH MINERALS) TABS tablet Take 1 tablet by mouth daily. Reported on 10/18/2015    [provider]  ondansetron (ZOFRAN) 4 MG tablet Take 1 tablet (4 mg total) by mouth every  6 (six) hours as needed for nausea. Patient not taking: Reported on 10/18/2015 09/15/15   Alison Murray, MD  senna-docusate (SENOKOT-S) 8.6-50 MG tablet Take 1 tablet by mouth at bedtime as needed for mild constipation. Patient not taking: Reported on 10/18/2015 09/15/15   Alison Murray, MD  terazosin (HYTRIN) 5 MG capsule Take 5 mg by mouth 2 (two) times daily.  09/06/15   [provider]  timolol (TIMOPTIC-XR) 0.5 % ophthalmic gel-forming Place 1 drop into both eyes at bedtime.  09/06/15   [provider]    Allergies    Penicillins  Review of Systems   Review of Systems  Gastrointestinal: Positive for abdominal pain.  Genitourinary: Positive for hematuria.  All other systems reviewed and are  negative.   Physical Exam Updated Vital Signs BP (!) 185/83   Pulse (!) 58   Temp 97.6 F (36.4 C) (Oral)   Resp 16   SpO2 98%   Physical Exam Vitals and nursing note reviewed.  Constitutional:      Appearance: He is well-developed.  HENT:     Head: Normocephalic and atraumatic.  Eyes:     Conjunctiva/sclera: Conjunctivae normal.  Cardiovascular:     Rate and Rhythm: Normal rate and regular rhythm.  Pulmonary:     Effort: Pulmonary effort is normal. No respiratory distress.     Breath sounds: Normal breath sounds.  Abdominal:     General: Bowel sounds are normal.     Palpations: Abdomen is soft.     Comments: Palpable tender mass to the suprapubic region.  Skin:    General: Skin is warm.  Neurological:     Mental Status: He is alert.  Psychiatric:        Behavior: Behavior normal.     ED Results / Procedures / Treatments   Labs (all labs ordered are listed, but only abnormal results are displayed) Labs Reviewed  URINE CULTURE  URINALYSIS, ROUTINE W REFLEX MICROSCOPIC    EKG None  Radiology No results found.  Procedures Procedures (including critical care time)  Medications Ordered in ED Medications  ondansetron (ZOFRAN-ODT) disintegrating tablet 4 mg (4 mg Oral Given 05/13/20 2252)  ondansetron (ZOFRAN) injection 4 mg (4 mg Intravenous Given 05/13/20 2252)  fentaNYL (SUBLIMAZE) injection 25 mcg (25 mcg Intravenous Given 05/13/20 2253)    ED Course  I have reviewed the triage vital signs and the nursing notes.  Pertinent labs & imaging results that were available during my care of the patient were reviewed by me and considered in my medical decision making (see chart for details).    MDM Rules/Calculators/A&P                          Patient presenting for Foley catheter dysfunction that began today.  He has rather recent diagnosis of prostate and bladder mass concerning for cancer, now on palliative care.  He appears to have hematuria which has  been coming and going as of recently per patient's daughter at bedside.  He is also having some nausea with dry heaving but also has been ongoing and was not new as of today.  On exam he has tender palpable mass in the suprapubic region, likely due to distended bladder with contributing large mass.  Foley catheter is replaced with three-way, and is being irrigated.  UA sent per request of daughter with concern for recurrent UTI.  Care assumed at shift change by PA McDonald, pending irrigation completion  and to follow UA result.  Anticipated discharge to home with outpatient follow-up.   Final Clinical Impression(s) / ED Diagnoses Final diagnoses:  None    Rx / DC Orders ED Discharge Orders    None       Novelle Addair, [place] N, PA-C 05/14/20 0014    Mancel Bale, MD 05/18/20 1218

## 2020-05-13 NOTE — ED Triage Notes (Signed)
Patient arrived from home A&O x4. Patient is on hospice due to prostate cancer. Home health nurse reports blocked foley and recommended coming to the ER. Wife called [facility].   Bp: 114/80-60-18-98% RA 97.8

## 2020-05-14 DIAGNOSIS — R5381 Other malaise: Secondary | ICD-10-CM | POA: Diagnosis not present

## 2020-05-14 DIAGNOSIS — R339 Retention of urine, unspecified: Secondary | ICD-10-CM | POA: Diagnosis not present

## 2020-05-14 DIAGNOSIS — M2550 Pain in unspecified joint: Secondary | ICD-10-CM | POA: Diagnosis not present

## 2020-05-14 DIAGNOSIS — Z7401 Bed confinement status: Secondary | ICD-10-CM | POA: Diagnosis not present

## 2020-05-14 LAB — URINALYSIS, ROUTINE W REFLEX MICROSCOPIC
Bilirubin Urine: NEGATIVE
Glucose, UA: NEGATIVE mg/dL
Ketones, ur: NEGATIVE mg/dL
Nitrite: NEGATIVE
Protein, ur: 100 mg/dL — AB
RBC / HPF: >50 RBC/hpf — ABNORMAL HIGH (ref 0–5)
Specific Gravity, Urine: 1.006 (ref 1.005–1.030)
pH: 6 (ref 5.0–8.0)

## 2020-05-14 MED ORDER — FENTANYL CITRATE (PF) 100 MCG/2ML IJ SOLN
75.0000 ug | Freq: Once | INTRAMUSCULAR | Status: AC
  Administered 2020-05-14: 75 ug via INTRAVENOUS
  Filled 2020-05-14: qty 2

## 2020-05-14 NOTE — ED Notes (Signed)
Patient is being discharged. [facility] is transporting patient home.Daughter has been notified. Hospice nurse has been called and is aware of everything done at the hospital and did verify that hospice can irrigate foley.

## 2020-05-14 NOTE — ED Provider Notes (Signed)
86 year old male received a signout from PA Wadsworth pending UA. Per her HPI:   "Alexander Diaz is a 86 y.o. male  Presenting from home with obstructed Foley catheter that began today.  Patient's daughter is at bedside, states home health nurse noticed blood in Foley bag today, irrigated a few times and changed out the catheter, however could not get it to continue to drain properly therefore brought him in for evaluation.  He is complaining of some lower abdominal pain radiating to his back.  He has nausea with dry heaving though this appears to be ongoing for him per chart review.  Patient has recent diagnosis of prostate and bladder mass concerning for cancer - he is on on palliative care at home.  Patient's daughter states the bleeding has been coming and going recently.  Per chart review, it appears he follows with Dr. Mena Goes  The history is provided by the patient and a relative."   Physical Exam  BP 113/60   Pulse (!) 58   Temp 97.6 F (36.4 C) (Oral)   Resp 17   SpO2 100%   Physical Exam Vitals and nursing note reviewed.  Constitutional:      Appearance: He is well-developed.     Comments: Extraordinarily pleasant elderly male.  HENT:     Head: Normocephalic and atraumatic.     Ears:     Comments: He is hard of hearing. Eyes:     Comments: Patient is blind  Pulmonary:     Effort: Pulmonary effort is normal.  Genitourinary:    Comments: Foley catheter in place. Musculoskeletal:     Cervical back: Neck supple.  Neurological:     Mental Status: He is alert.     Cranial Nerves: No cranial nerve deficit.  Psychiatric:        Behavior: Behavior normal.     ED Course/Procedures   Clinical Course as of May 14 645  Fri May 14, 2020  0502 Spoke with Dr. Benancio Deeds with urology.  Consult appreciated.  RN reattempted Foley irrigation and placement.  Foley catheter is now successfully draining.  We will continue to monitor.  We will plan to call the patient's daughter with an  update then plans for transport to home as long as Foley catheter continues to successfully drain.  We will continue to monitor.   [MM]    Clinical Course User Index [MM] Marykathleen Russi, Coral Else, PA-C    Procedures  MDM  86 year old male received at signout from [facility] pending urinalysis.  Please see her note for further work-up and medical decision making.  In brief, this patient was recently diagnosed with an inoperable bladder mass and presented from home with an obstructed Foley catheter.  Foley catheter was exchanged and initially was irrigating in the ER with a 24 French catheter.   Labs have been reviewed and personally interpreted by me.  UA with hemoglobinuria and RBCs, likely secondary to bladder mass versus trauma from Foley catheter replacement.  He has trace leukocyte esterase, but urine is not overly infectious.  Will defer antibiotics at this time.  Urine culture has been sent.  On re-evaluation, the patient's Foley catheter was examined and appeared to be clotted and was not actively draining.  I spoke with Dr. Benancio Deeds, urology, who recommended irrigation tips to RN.  After speaking with Dr. Benancio Deeds, Foley catheter had been adjusted and was actively draining.  The patient was then monitored for more than 2 hours with continued flow from the Foley  catheter.  I then updated the patient's daughter, Alexander Diaz, and inform her that the obstruction of the Foley catheter had resolved and was functioning in the ER.  We discussed his lab results and the patient was appropriate for discharge.  She requested a call when PT arrived prior to transporting him home.  Advised RN to make sure family was updated at that time.  Patient has been monitored for more than 9 hours in the emergency department and is hemodynamically stable and in no acute distress.  ER return precautions given.  Safe for discharge to home with continued hospice care.      Barkley Boards, PA-C 05/14/20 0646    Paula Libra, MD 05/14/20 [phone]

## 2020-05-14 NOTE — Discharge Instructions (Addendum)
Thank you so much for allowing me to care for you today in the Emergency Department.   Your Foley catheter was exchanged tonight in the ER.  We spoke with the hospice team who was able to irrigate the catheter at home.  They have been updated on your care tonight in the emergency department.  Your urine did not appear concerning for an infection at this time, but I did send it for culture.  You may receive a call from the hospital at the culture is positive.  At this time though, antibiotics are not indicated.  Please continue to follow closely with your health care team.  Return to the emergency department if your Foley stops draining, if you develop uncontrollable abdominal pain, or other new, concerning symptoms.

## 2020-05-14 NOTE — ED Notes (Signed)
[facility] called for transport.  

## 2020-05-14 NOTE — ED Notes (Signed)
[facility] here for transport. Called daughter and made her aware. 3 way foley remains in place and patent.

## 2020-05-16 LAB — URINE CULTURE: Culture: 20000 — AB

## 2020-05-17 ENCOUNTER — Telehealth: Payer: Self-pay | Admitting: Emergency Medicine

## 2020-05-17 NOTE — Progress Notes (Signed)
ED Antimicrobial Stewardship Positive Culture Follow Up   Alexander Diaz is an 86 y.o. male who presented to [facility] on 05/13/2020 with a chief complaint of  Chief Complaint  Patient presents with  . Urinary Retention    foley blocked    Recent Results (from the past 720 hour(s))  Urine culture     Status: Abnormal   Collection Time: 05/13/20 10:44 PM   Specimen: Urine, Catheterized  Result Value Ref Range Status   Specimen Description   Final    URINE, CATHETERIZED Performed at [facility], [address], [place], [place] [address]    Special Requests   Final    NONE Performed at [facility], [address], [place], [place] [address]    Culture (A)  Final    20,000 COLONIES/mL ESCHERICHIA COLI 80,000 COLONIES/mL ENTEROCOCCUS FAECALIS    Report Status 05/16/2020 FINAL  Final   Organism ID, Bacteria ESCHERICHIA COLI (A)  Final   Organism ID, Bacteria ENTEROCOCCUS FAECALIS (A)  Final      Susceptibility   Escherichia coli - MIC*    AMPICILLIN 8 SENSITIVE Sensitive     CEFAZOLIN <=4 SENSITIVE Sensitive     CEFTRIAXONE <=0.25 SENSITIVE Sensitive     CIPROFLOXACIN <=0.25 SENSITIVE Sensitive     GENTAMICIN <=1 SENSITIVE Sensitive     IMIPENEM <=0.25 SENSITIVE Sensitive     NITROFURANTOIN <=16 SENSITIVE Sensitive     TRIMETH/SULFA <=20 SENSITIVE Sensitive     AMPICILLIN/SULBACTAM <=2 SENSITIVE Sensitive     PIP/TAZO <=4 SENSITIVE Sensitive     * 20,000 COLONIES/mL ESCHERICHIA COLI   Enterococcus faecalis - MIC*    AMPICILLIN <=2 SENSITIVE Sensitive     NITROFURANTOIN <=16 SENSITIVE Sensitive     VANCOMYCIN 1 SENSITIVE Sensitive     * 80,000 COLONIES/mL ENTEROCOCCUS FAECALIS   Hematuria likely due to bladder mass / trauma from foley. No other urinary symptoms. No treatment necessary.  ED Provider: Army Melia, PA-C   Lucina Mellow, PharmD Candidate 05/17/2020, 10:25 AM

## 2020-05-17 NOTE — Telephone Encounter (Signed)
Post ED Visit - Positive Culture Follow-up  Culture report reviewed by antimicrobial stewardship pharmacist: [facility] [x]  , Pharm.D. []  Enzo Bi, Pharm.D., BCPS AQ-ID []  , Pharm.D., BCPS []  Celedonio Miyamoto, Pharm.D., BCPS []  [place], Garvin Fila.D., BCPS, AAHIVP []  , Pharm.D., BCPS, AAHIVP []  Georgina Pillion, PharmD, BCPS []  , PharmD, BCPS []  [place], PharmD, BCPS []  [address], PharmD []  , PharmD, BCPS []  Estella Husk, PharmD  Pharmacy Team []  Lysle Pearl, PharmD []  , PharmD []  Phillips Climes, PharmD []  , Rph []  Agapito Games) , PharmD []  Verlan Friends, PharmD []  , PharmD []  Mervyn Gay, PharmD []  , PharmD []  Vinnie Level, PharmD []  W[facility], PharmD []  , PharmD []  Len Childs, PharmD   Positive urine culture Treated with none, results forwarded to palliative care team and no further patient follow-up is required at this time.  05/17/2020, 11:19 AM

## 2020-06-16 DEATH — deceased
